# Patient Record
Sex: Female | Born: 1940 | Race: White | Marital: Married | Smoking: Never smoker
Health system: Southern US, Community
[De-identification: ages and names within clinical notes are randomized; demographics above are authoritative.]

## PROBLEM LIST (undated history)

## (undated) DIAGNOSIS — M329 Systemic lupus erythematosus, unspecified: Secondary | ICD-10-CM

## (undated) DIAGNOSIS — I10 Essential (primary) hypertension: Secondary | ICD-10-CM

## (undated) DIAGNOSIS — C801 Malignant (primary) neoplasm, unspecified: Secondary | ICD-10-CM

## (undated) DIAGNOSIS — S22080A Wedge compression fracture of T11-T12 vertebra, initial encounter for closed fracture: Secondary | ICD-10-CM

## (undated) DIAGNOSIS — M1990 Unspecified osteoarthritis, unspecified site: Secondary | ICD-10-CM

## (undated) DIAGNOSIS — F329 Major depressive disorder, single episode, unspecified: Secondary | ICD-10-CM

## (undated) DIAGNOSIS — F32A Depression, unspecified: Secondary | ICD-10-CM

## (undated) DIAGNOSIS — H409 Unspecified glaucoma: Secondary | ICD-10-CM

## (undated) DIAGNOSIS — E039 Hypothyroidism, unspecified: Secondary | ICD-10-CM

## (undated) DIAGNOSIS — J189 Pneumonia, unspecified organism: Secondary | ICD-10-CM

## (undated) DIAGNOSIS — F419 Anxiety disorder, unspecified: Secondary | ICD-10-CM

## (undated) DIAGNOSIS — M069 Rheumatoid arthritis, unspecified: Secondary | ICD-10-CM

## (undated) HISTORY — PX: CATARACT EXTRACTION, BILATERAL: SHX1313

## (undated) HISTORY — PX: DILATION AND CURETTAGE OF UTERUS: SHX78

## (undated) HISTORY — PX: BREAST SURGERY: SHX581

---

## 2015-12-18 ENCOUNTER — Other Ambulatory Visit: Payer: Self-pay | Admitting: Neurosurgery

## 2015-12-18 DIAGNOSIS — M542 Cervicalgia: Secondary | ICD-10-CM

## 2015-12-18 DIAGNOSIS — M48061 Spinal stenosis, lumbar region without neurogenic claudication: Secondary | ICD-10-CM

## 2015-12-25 ENCOUNTER — Ambulatory Visit
Admission: RE | Admit: 2015-12-25 | Discharge: 2015-12-25 | Disposition: A | Discharge status: Home / Self Care | Payer: Medicare Other | Source: Ambulatory Visit | Attending: Neurosurgery | Admitting: Neurosurgery

## 2015-12-25 DIAGNOSIS — M542 Cervicalgia: Secondary | ICD-10-CM

## 2015-12-25 DIAGNOSIS — M48061 Spinal stenosis, lumbar region without neurogenic claudication: Secondary | ICD-10-CM

## 2016-01-03 HISTORY — PX: BACK SURGERY: SHX140

## 2016-10-01 ENCOUNTER — Other Ambulatory Visit: Payer: Self-pay | Admitting: Neurosurgery

## 2016-10-02 ENCOUNTER — Encounter (HOSPITAL_COMMUNITY): Payer: Self-pay | Admitting: *Deleted

## 2016-10-03 NOTE — H&P (Signed)
Patient ID:   [id number] Patient: Danielle Castaneda  Date of Birth: Nov 03, 1941 Visit Type: Office Visit   Date: 09/30/2016 03:00 PM Provider: Marchia Meiers. Vertell Limber MD   This 75 year old female presents for back pain.  History of Present Illness: 1.  back pain    01/02/16 ACDF C7-T1  The patient comes in for evaluation of severe lumbar pain after she over-exerted herself while moving 4 weeks ago. She reports that her neck is doing well today, but her UE strength is not quite full. She states the pain is across her low back, but does not radiate into her legs. Today, she is struggling to walk due to the pain. Pain is 10/10 today.   Physical Exam: Deltoid strength is difficult to test due to low back pain, rest of UE strength is normal    X-rays reveal compression fracture of T12 with 50 % vertebral body height loss, levoconvex scoliosis        MEDICATIONS(added, continued or stopped this visit): Started Medication Directions Instruction Stopped   alendronate 35 mg tablet take 1 tablet by oral route  every week in the morning, at least 30 min before first food, beverage, or medication of day  09/30/2016   alprazolam 1 mg tablet take 1 tablet by oral route 2 times every day     Ambien 10 mg tablet take 1 tablet by oral route  every day at bedtime     baclofen 5mg  ORAL TABLET      ergocalciferol (vitamin D2) 50,000 unit capsule take 1 capsule by oral route  every week     gabapentin 800 mg tablet take 1 tablet by oral route 3 times every day     latanoprost 0.005 % eye drops instill 1 drop by ophthalmic route  every day into affected eye(s) in the evening     levothyroxine 175 mcg tablet take 1 tablet by oral route  every day     Lexapro 10 mg tablet take 1 tablet by oral route  every day    01/29/2016 Percocet 10 mg-325 mg tablet take 1/2 - 1 tab po q6hrs prn pain  09/30/2016  09/30/2016 Percocet 10 mg-325 mg tablet take 1/2 - 1 tab po q6hrs prn pain     Plaquenil 200 mg tablet take 1  tablet by oral route  every day     potassium take 1 tablet daily  09/30/2016   potassium chloride ER 20 mEq tablet,extended release take 1 tablet by oral route  every day     simvastatin take 1 tablet by oral route  every day in the evening  09/30/2016   sulfasalazine 500 mg tablet take 1 tablet by oral route 2 times every day after meals  09/30/2016  01/03/2016 tizanidine 2 mg tablet take 1-2 po up to TID prn spasms  09/30/2016   triamterene 37.5 mg-hydrochlorothiazide 25 mg tablet take 1 tablet by oral route  every day       ALLERGIES: Ingredient Reaction Medication Name Comment  LEVOFLOXACIN  Levaquin   TETRACYCLINE         Vitals Date Temp F BP Pulse Ht In Wt Lb BMI BSA Pain Score  09/30/2016  163/91 69 64 169 29.01  10/10      IMPRESSION The patient has been experiencing severe low back pain for the past 4 weeks. She is struggling to walk today due to the pain. On review of her X-rays done today, she has a compression fracture of T12. Since  she is in such severe pain, I recommend a T12 kyphoplasty for alleviation of her low back symptoms. I will refill her pain medication today for additional relief.  Completed Orders (this encounter) Order Details Reason Side Interpretation Result Initial Treatment Date Region  Thoraco-lumbar Spine- AP/Lat      09/30/2016    Assessment/Plan # Detail Type Description   1. 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         Refilled oxycodone. Schedule T12 kyphoplasty. Nurse education given.   Orders: Diagnostic Procedures: Assessment Procedure  M41.26 Thoraco-lumbar Spine- AP/Lat    MEDICATIONS PRESCRIBED TODAY    Rx Quantity Refills  PERCOCET 10 mg-325 mg  60 0            Provider:  Marchia Meiers. Vertell Limber MD  09/30/2016 03:48 PM Dictation edited by: Johnella Moloney    CC Providers: Erline Levine MD [address] [place], [place] [address]              Electronically signed by Marchia Meiers. Vertell Limber MD on 10/01/2016 05:07 PM

## 2016-10-04 ENCOUNTER — Ambulatory Visit (HOSPITAL_COMMUNITY): Payer: Medicare Other | Admitting: Certified Registered Nurse Anesthetist

## 2016-10-04 ENCOUNTER — Encounter (HOSPITAL_COMMUNITY): Payer: Self-pay | Admitting: Certified Registered Nurse Anesthetist

## 2016-10-04 ENCOUNTER — Encounter (HOSPITAL_COMMUNITY)
Admission: RE | Disposition: A | Discharge status: Home / Self Care | Payer: Self-pay | Source: Ambulatory Visit | Attending: Neurosurgery

## 2016-10-04 ENCOUNTER — Inpatient Hospital Stay (HOSPITAL_COMMUNITY): Payer: Medicare Other

## 2016-10-04 ENCOUNTER — Inpatient Hospital Stay (HOSPITAL_COMMUNITY)
Admission: RE | Admit: 2016-10-04 | Discharge: 2016-10-05 | DRG: 517 | Disposition: A | Discharge status: Home / Self Care | Payer: Medicare Other | Source: Ambulatory Visit | Attending: Neurosurgery | Admitting: Neurosurgery

## 2016-10-04 DIAGNOSIS — M4854XA Collapsed vertebra, not elsewhere classified, thoracic region, initial encounter for fracture: Principal | ICD-10-CM | POA: Diagnosis present

## 2016-10-04 DIAGNOSIS — E039 Hypothyroidism, unspecified: Secondary | ICD-10-CM | POA: Diagnosis present

## 2016-10-04 DIAGNOSIS — F329 Major depressive disorder, single episode, unspecified: Secondary | ICD-10-CM | POA: Diagnosis present

## 2016-10-04 DIAGNOSIS — M4120 Other idiopathic scoliosis, site unspecified: Secondary | ICD-10-CM | POA: Diagnosis present

## 2016-10-04 DIAGNOSIS — Z9889 Other specified postprocedural states: Secondary | ICD-10-CM

## 2016-10-04 DIAGNOSIS — F419 Anxiety disorder, unspecified: Secondary | ICD-10-CM | POA: Diagnosis present

## 2016-10-04 DIAGNOSIS — M549 Dorsalgia, unspecified: Secondary | ICD-10-CM | POA: Diagnosis present

## 2016-10-04 DIAGNOSIS — S22000A Wedge compression fracture of unspecified thoracic vertebra, initial encounter for closed fracture: Secondary | ICD-10-CM | POA: Diagnosis present

## 2016-10-04 HISTORY — DX: Unspecified osteoarthritis, unspecified site: M19.90

## 2016-10-04 HISTORY — DX: Malignant (primary) neoplasm, unspecified: C80.1

## 2016-10-04 HISTORY — DX: Systemic lupus erythematosus, unspecified: M32.9

## 2016-10-04 HISTORY — DX: Rheumatoid arthritis, unspecified: M06.9

## 2016-10-04 HISTORY — DX: Hypothyroidism, unspecified: E03.9

## 2016-10-04 HISTORY — DX: Major depressive disorder, single episode, unspecified: F32.9

## 2016-10-04 HISTORY — DX: Depression, unspecified: F32.A

## 2016-10-04 HISTORY — DX: Anxiety disorder, unspecified: F41.9

## 2016-10-04 HISTORY — PX: KYPHOPLASTY: SHX5884

## 2016-10-04 HISTORY — DX: Pneumonia, unspecified organism: J18.9

## 2016-10-04 LAB — BASIC METABOLIC PANEL
Anion gap: 10 (ref 5–15)
BUN: 10 mg/dL (ref 6–20)
CALCIUM: 9.2 mg/dL (ref 8.9–10.3)
CO2: 28 mmol/L (ref 22–32)
CREATININE: 0.7 mg/dL (ref 0.44–1.00)
Chloride: 94 mmol/L — ABNORMAL LOW (ref 101–111)
GFR calc non Af Amer: >60 mL/min (ref >60)
GLUCOSE: 91 mg/dL (ref 65–99)
Potassium: 3.3 mmol/L — ABNORMAL LOW (ref 3.5–5.1)
Sodium: 132 mmol/L — ABNORMAL LOW (ref 135–145)

## 2016-10-04 LAB — CBC
HCT: 40.6 % (ref 36.0–46.0)
Hemoglobin: 14.1 g/dL (ref 12.0–15.0)
MCH: 32.5 pg (ref 26.0–34.0)
MCHC: 34.7 g/dL (ref 30.0–36.0)
MCV: 93.5 fL (ref 78.0–100.0)
PLATELETS: 257 10*3/uL (ref 150–400)
RBC: 4.34 MIL/uL (ref 3.87–5.11)
RDW: 13.8 % (ref 11.5–15.5)
WBC: 8.1 10*3/uL (ref 4.0–10.5)

## 2016-10-04 LAB — SURGICAL PCR SCREEN
MRSA, PCR: POSITIVE — AB
Staphylococcus aureus: POSITIVE — AB

## 2016-10-04 SURGERY — KYPHOPLASTY
Anesthesia: General | Site: Back

## 2016-10-04 MED ORDER — IOPAMIDOL (ISOVUE-300) INJECTION 61%
INTRAVENOUS | Status: DC | PRN
  Administered 2016-10-04: 6 mL via INTRAVENOUS

## 2016-10-04 MED ORDER — ALPRAZOLAM 0.5 MG PO TABS
0.5000 mg | ORAL_TABLET | Freq: Every day | ORAL | Status: DC
  Administered 2016-10-04: 0.5 mg via ORAL
  Filled 2016-10-04: qty 1

## 2016-10-04 MED ORDER — METHOCARBAMOL 500 MG PO TABS
ORAL_TABLET | ORAL | Status: AC
  Filled 2016-10-04: qty 1

## 2016-10-04 MED ORDER — FENTANYL CITRATE (PF) 100 MCG/2ML IJ SOLN
INTRAMUSCULAR | Status: AC
  Filled 2016-10-04: qty 4

## 2016-10-04 MED ORDER — FENTANYL CITRATE (PF) 100 MCG/2ML IJ SOLN
25.0000 ug | INTRAMUSCULAR | Status: DC | PRN
  Administered 2016-10-04 (×2): 50 ug via INTRAVENOUS

## 2016-10-04 MED ORDER — OXYCODONE-ACETAMINOPHEN 10-325 MG PO TABS: 0.5000 | ORAL_TABLET | Freq: Four times a day (QID) | ORAL | Status: DC | PRN

## 2016-10-04 MED ORDER — KCL IN DEXTROSE-NACL 20-5-0.45 MEQ/L-%-% IV SOLN: INTRAVENOUS | Status: DC

## 2016-10-04 MED ORDER — ONDANSETRON HCL 4 MG/2ML IJ SOLN
INTRAMUSCULAR | Status: AC
  Filled 2016-10-04: qty 2

## 2016-10-04 MED ORDER — HYDROXYCHLOROQUINE SULFATE 200 MG PO TABS
200.0000 mg | ORAL_TABLET | Freq: Every day | ORAL | Status: DC
  Administered 2016-10-05: 200 mg via ORAL
  Filled 2016-10-04: qty 1

## 2016-10-04 MED ORDER — PROPOFOL 10 MG/ML IV BOLUS
INTRAVENOUS | Status: AC
  Filled 2016-10-04: qty 20

## 2016-10-04 MED ORDER — FENTANYL CITRATE (PF) 100 MCG/2ML IJ SOLN
INTRAMUSCULAR | Status: AC
  Filled 2016-10-04: qty 2

## 2016-10-04 MED ORDER — PROPOFOL 10 MG/ML IV BOLUS
INTRAVENOUS | Status: DC | PRN
Start: 2016-10-04 — End: 2016-10-04
  Administered 2016-10-04: 120 mg via INTRAVENOUS

## 2016-10-04 MED ORDER — 0.9 % SODIUM CHLORIDE (POUR BTL) OPTIME
TOPICAL | Status: DC | PRN
  Administered 2016-10-04 (×2): 1000 mL

## 2016-10-04 MED ORDER — DEXAMETHASONE SODIUM PHOSPHATE 10 MG/ML IJ SOLN
INTRAMUSCULAR | Status: DC | PRN
  Administered 2016-10-04: 10 mg via INTRAVENOUS

## 2016-10-04 MED ORDER — ROCURONIUM BROMIDE 10 MG/ML (PF) SYRINGE
PREFILLED_SYRINGE | INTRAVENOUS | Status: DC | PRN
  Administered 2016-10-04: 40 mg via INTRAVENOUS

## 2016-10-04 MED ORDER — PANTOPRAZOLE SODIUM 40 MG IV SOLR
40.0000 mg | Freq: Every day | INTRAVENOUS | Status: DC
  Administered 2016-10-04: 40 mg via INTRAVENOUS
  Filled 2016-10-04: qty 40

## 2016-10-04 MED ORDER — SODIUM CHLORIDE 0.9 % IV SOLN: 250.0000 mL | INTRAVENOUS | Status: DC

## 2016-10-04 MED ORDER — HYDROMORPHONE HCL 1 MG/ML IJ SOLN: 0.5000 mg | INTRAMUSCULAR | Status: DC | PRN

## 2016-10-04 MED ORDER — GABAPENTIN 800 MG PO TABS
800.0000 mg | ORAL_TABLET | Freq: Every day | ORAL | Status: DC
  Administered 2016-10-04: 800 mg via ORAL
  Filled 2016-10-04: qty 1

## 2016-10-04 MED ORDER — FLEET ENEMA 7-19 GM/118ML RE ENEM: 1.0000 | ENEMA | Freq: Once | RECTAL | Status: DC | PRN

## 2016-10-04 MED ORDER — LIDOCAINE 2% (20 MG/ML) 5 ML SYRINGE
INTRAMUSCULAR | Status: AC
  Filled 2016-10-04: qty 5

## 2016-10-04 MED ORDER — OXYCODONE-ACETAMINOPHEN 5-325 MG PO TABS
1.0000 | ORAL_TABLET | ORAL | Status: DC | PRN
  Administered 2016-10-04 – 2016-10-05 (×4): 2 via ORAL
  Filled 2016-10-04 (×3): qty 2

## 2016-10-04 MED ORDER — MENTHOL 3 MG MT LOZG: 1.0000 | LOZENGE | OROMUCOSAL | Status: DC | PRN

## 2016-10-04 MED ORDER — MIDAZOLAM HCL 2 MG/2ML IJ SOLN
INTRAMUSCULAR | Status: AC
  Filled 2016-10-04: qty 2

## 2016-10-04 MED ORDER — LIDOCAINE 2% (20 MG/ML) 5 ML SYRINGE
INTRAMUSCULAR | Status: DC | PRN
  Administered 2016-10-04: 60 mg via INTRAVENOUS

## 2016-10-04 MED ORDER — LATANOPROST 0.005 % OP SOLN
1.0000 [drp] | Freq: Every day | OPHTHALMIC | Status: DC
  Administered 2016-10-04: 1 [drp] via OPHTHALMIC
  Filled 2016-10-04: qty 2.5

## 2016-10-04 MED ORDER — TURMERIC CURCUMIN 500 MG PO CAPS: 1.0000 | ORAL_CAPSULE | Freq: Every day | ORAL | Status: DC

## 2016-10-04 MED ORDER — SODIUM CHLORIDE 0.9% FLUSH: 3.0000 mL | INTRAVENOUS | Status: DC | PRN

## 2016-10-04 MED ORDER — CEFAZOLIN SODIUM-DEXTROSE 2-4 GM/100ML-% IV SOLN
2.0000 g | INTRAVENOUS | Status: AC
  Administered 2016-10-04: 2 g via INTRAVENOUS
  Filled 2016-10-04: qty 100

## 2016-10-04 MED ORDER — CHLORHEXIDINE GLUCONATE CLOTH 2 % EX PADS: 6.0000 | MEDICATED_PAD | Freq: Once | CUTANEOUS | Status: DC

## 2016-10-04 MED ORDER — ZOLPIDEM TARTRATE 5 MG PO TABS: 5.0000 mg | ORAL_TABLET | Freq: Every evening | ORAL | Status: DC | PRN

## 2016-10-04 MED ORDER — TRIAMTERENE-HCTZ 37.5-25 MG PO CAPS
1.0000 | ORAL_CAPSULE | Freq: Every day | ORAL | Status: DC
  Filled 2016-10-04: qty 1

## 2016-10-04 MED ORDER — FENTANYL CITRATE (PF) 100 MCG/2ML IJ SOLN
INTRAMUSCULAR | Status: DC | PRN
  Administered 2016-10-04 (×4): 50 ug via INTRAVENOUS

## 2016-10-04 MED ORDER — LACTATED RINGERS IV SOLN
INTRAVENOUS | Status: DC
  Administered 2016-10-04 (×2): via INTRAVENOUS

## 2016-10-04 MED ORDER — HYDROCODONE-ACETAMINOPHEN 5-325 MG PO TABS
1.0000 | ORAL_TABLET | ORAL | Status: DC | PRN
  Administered 2016-10-04: 2 via ORAL
  Filled 2016-10-04: qty 2

## 2016-10-04 MED ORDER — EPHEDRINE SULFATE 50 MG/ML IJ SOLN
INTRAMUSCULAR | Status: DC | PRN
  Administered 2016-10-04 (×3): 5 mg via INTRAVENOUS

## 2016-10-04 MED ORDER — OXYCODONE-ACETAMINOPHEN 5-325 MG PO TABS
ORAL_TABLET | ORAL | Status: AC
  Filled 2016-10-04: qty 2

## 2016-10-04 MED ORDER — LEVOTHYROXINE SODIUM 175 MCG PO TABS
175.0000 ug | ORAL_TABLET | Freq: Every day | ORAL | Status: DC
  Administered 2016-10-05: 175 ug via ORAL
  Filled 2016-10-04: qty 1

## 2016-10-04 MED ORDER — COQ10 200 MG PO CAPS: 1.0000 | ORAL_CAPSULE | Freq: Every day | ORAL | Status: DC

## 2016-10-04 MED ORDER — ESCITALOPRAM OXALATE 10 MG PO TABS
10.0000 mg | ORAL_TABLET | Freq: Every day | ORAL | Status: DC
  Administered 2016-10-05: 10 mg via ORAL
  Filled 2016-10-04: qty 1

## 2016-10-04 MED ORDER — ARTIFICIAL TEARS OP OINT
TOPICAL_OINTMENT | OPHTHALMIC | Status: AC
  Filled 2016-10-04: qty 3.5

## 2016-10-04 MED ORDER — SODIUM CHLORIDE 0.9% FLUSH
3.0000 mL | Freq: Two times a day (BID) | INTRAVENOUS | Status: DC
  Administered 2016-10-04: 3 mL via INTRAVENOUS

## 2016-10-04 MED ORDER — ACETAMINOPHEN 325 MG PO TABS: 650.0000 mg | ORAL_TABLET | ORAL | Status: DC | PRN

## 2016-10-04 MED ORDER — PHENOL 1.4 % MT LIQD: 1.0000 | OROMUCOSAL | Status: DC | PRN

## 2016-10-04 MED ORDER — MUPIROCIN 2 % EX OINT
1.0000 "application " | TOPICAL_OINTMENT | Freq: Once | CUTANEOUS | Status: AC
  Administered 2016-10-04: 1 via TOPICAL
  Filled 2016-10-04: qty 22

## 2016-10-04 MED ORDER — DOCUSATE SODIUM 100 MG PO CAPS
100.0000 mg | ORAL_CAPSULE | Freq: Two times a day (BID) | ORAL | Status: DC
  Administered 2016-10-04 – 2016-10-05 (×2): 100 mg via ORAL
  Filled 2016-10-04 (×2): qty 1

## 2016-10-04 MED ORDER — SUGAMMADEX SODIUM 200 MG/2ML IV SOLN
INTRAVENOUS | Status: DC | PRN
  Administered 2016-10-04: 200 mg via INTRAVENOUS

## 2016-10-04 MED ORDER — SULFASALAZINE 500 MG PO TABS
500.0000 mg | ORAL_TABLET | Freq: Every day | ORAL | Status: DC
  Administered 2016-10-04 – 2016-10-05 (×2): 500 mg via ORAL
  Filled 2016-10-04 (×2): qty 1

## 2016-10-04 MED ORDER — ACETAMINOPHEN 650 MG RE SUPP: 650.0000 mg | RECTAL | Status: DC | PRN

## 2016-10-04 MED ORDER — ONDANSETRON HCL 4 MG/2ML IJ SOLN: 4.0000 mg | INTRAMUSCULAR | Status: DC | PRN

## 2016-10-04 MED ORDER — METHOCARBAMOL 500 MG PO TABS
500.0000 mg | ORAL_TABLET | Freq: Four times a day (QID) | ORAL | Status: DC | PRN
  Administered 2016-10-04 – 2016-10-05 (×2): 500 mg via ORAL
  Filled 2016-10-04: qty 1

## 2016-10-04 MED ORDER — ALUM & MAG HYDROXIDE-SIMETH 200-200-20 MG/5ML PO SUSP: 30.0000 mL | Freq: Four times a day (QID) | ORAL | Status: DC | PRN

## 2016-10-04 MED ORDER — ROCURONIUM BROMIDE 10 MG/ML (PF) SYRINGE
PREFILLED_SYRINGE | INTRAVENOUS | Status: AC
  Filled 2016-10-04: qty 10

## 2016-10-04 MED ORDER — BISACODYL 5 MG PO TBEC: 5.0000 mg | DELAYED_RELEASE_TABLET | Freq: Every day | ORAL | Status: DC | PRN

## 2016-10-04 MED ORDER — ZOLPIDEM TARTRATE 5 MG PO TABS
5.0000 mg | ORAL_TABLET | Freq: Every day | ORAL | Status: DC
  Administered 2016-10-04: 5 mg via ORAL
  Filled 2016-10-04: qty 1

## 2016-10-04 MED ORDER — BUPIVACAINE HCL (PF) 0.5 % IJ SOLN
INTRAMUSCULAR | Status: DC | PRN
  Administered 2016-10-04: 5 mL

## 2016-10-04 MED ORDER — SENNOSIDES-DOCUSATE SODIUM 8.6-50 MG PO TABS: 1.0000 | ORAL_TABLET | Freq: Every evening | ORAL | Status: DC | PRN

## 2016-10-04 MED ORDER — MAGNESIUM OXIDE 400 (241.3 MG) MG PO TABS
400.0000 mg | ORAL_TABLET | Freq: Every day | ORAL | Status: DC
  Filled 2016-10-04: qty 1

## 2016-10-04 MED ORDER — CEFAZOLIN IN D5W 1 GM/50ML IV SOLN
1.0000 g | Freq: Three times a day (TID) | INTRAVENOUS | Status: AC
  Administered 2016-10-04 – 2016-10-05 (×2): 1 g via INTRAVENOUS
  Filled 2016-10-04 (×2): qty 50

## 2016-10-04 MED ORDER — LIDOCAINE-EPINEPHRINE 1 %-1:100000 IJ SOLN
INTRAMUSCULAR | Status: DC | PRN
  Administered 2016-10-04: 5 mL

## 2016-10-04 MED ORDER — METHOCARBAMOL 1000 MG/10ML IJ SOLN
500.0000 mg | Freq: Four times a day (QID) | INTRAVENOUS | Status: DC | PRN
  Filled 2016-10-04: qty 5

## 2016-10-04 MED ORDER — VITAMIN D (ERGOCALCIFEROL) 1.25 MG (50000 UNIT) PO CAPS: 50000.0000 [IU] | ORAL_CAPSULE | ORAL | Status: DC

## 2016-10-04 MED ORDER — MIDAZOLAM HCL 5 MG/5ML IJ SOLN
INTRAMUSCULAR | Status: DC | PRN
  Administered 2016-10-04: 1 mg via INTRAVENOUS

## 2016-10-04 MED ORDER — ONDANSETRON HCL 4 MG/2ML IJ SOLN
INTRAMUSCULAR | Status: DC | PRN
Start: 2016-10-04 — End: 2016-10-04
  Administered 2016-10-04: 4 mg via INTRAVENOUS

## 2016-10-04 MED ORDER — TIZANIDINE HCL 4 MG PO TABS
4.0000 mg | ORAL_TABLET | Freq: Every day | ORAL | Status: DC
  Administered 2016-10-04: 4 mg via ORAL
  Filled 2016-10-04: qty 1

## 2016-10-04 SURGICAL SUPPLY — 43 items
BLADE CLIPPER SURG (BLADE) IMPLANT
BLADE SURG 15 STRL LF DISP TIS (BLADE) ×1 IMPLANT
BLADE SURG 15 STRL SS (BLADE) ×2
CARTRIDGE OIL MAESTRO DRILL (MISCELLANEOUS) ×1 IMPLANT
CEMENT KYPHON CX01A KIT/MIXER (Cement) ×3 IMPLANT
DECANTER SPIKE VIAL GLASS SM (MISCELLANEOUS) ×3 IMPLANT
DERMABOND ADVANCED (GAUZE/BANDAGES/DRESSINGS) ×2
DERMABOND ADVANCED .7 DNX12 (GAUZE/BANDAGES/DRESSINGS) ×1 IMPLANT
DIFFUSER DRILL AIR PNEUMATIC (MISCELLANEOUS) ×3 IMPLANT
DRAPE C-ARM 42X72 X-RAY (DRAPES) ×3 IMPLANT
DRAPE HALF SHEET 40X57 (DRAPES) ×3 IMPLANT
DRAPE LAPAROTOMY 100X72X124 (DRAPES) ×3 IMPLANT
DRAPE SURG 17X23 STRL (DRAPES) ×3 IMPLANT
DRAPE WARM FLUID 44X44 (DRAPE) ×3 IMPLANT
DURAPREP 26ML APPLICATOR (WOUND CARE) ×3 IMPLANT
GAUZE SPONGE 4X4 16PLY XRAY LF (GAUZE/BANDAGES/DRESSINGS) ×3 IMPLANT
GLOVE BIO SURGEON STRL SZ8 (GLOVE) ×3 IMPLANT
GLOVE BIOGEL M STRL SZ7.5 (GLOVE) ×3 IMPLANT
GLOVE BIOGEL PI IND STRL 8.5 (GLOVE) ×1 IMPLANT
GLOVE BIOGEL PI INDICATOR 8.5 (GLOVE) ×2
GLOVE EXAM NITRILE LRG STRL (GLOVE) IMPLANT
GLOVE EXAM NITRILE XL STR (GLOVE) IMPLANT
GLOVE EXAM NITRILE XS STR PU (GLOVE) IMPLANT
GOWN STRL REUS W/ TWL LRG LVL3 (GOWN DISPOSABLE) IMPLANT
GOWN STRL REUS W/ TWL XL LVL3 (GOWN DISPOSABLE) IMPLANT
GOWN STRL REUS W/TWL 2XL LVL3 (GOWN DISPOSABLE) IMPLANT
GOWN STRL REUS W/TWL LRG LVL3 (GOWN DISPOSABLE)
GOWN STRL REUS W/TWL XL LVL3 (GOWN DISPOSABLE)
KIT BASIN OR (CUSTOM PROCEDURE TRAY) ×3 IMPLANT
KIT ROOM TURNOVER OR (KITS) ×3 IMPLANT
NEEDLE HYPO 25X1 1.5 SAFETY (NEEDLE) ×3 IMPLANT
NS IRRIG 1000ML POUR BTL (IV SOLUTION) ×3 IMPLANT
OIL CARTRIDGE MAESTRO DRILL (MISCELLANEOUS) ×3
PACK SURGICAL SETUP 50X90 (CUSTOM PROCEDURE TRAY) ×3 IMPLANT
PAD ARMBOARD 7.5X6 YLW CONV (MISCELLANEOUS) ×9 IMPLANT
STAPLER SKIN PROX WIDE 3.9 (STAPLE) ×3 IMPLANT
SUT VIC AB 3-0 SH 8-18 (SUTURE) ×3 IMPLANT
SYR CONTROL 10ML LL (SYRINGE) ×6 IMPLANT
TOWEL OR 17X24 6PK STRL BLUE (TOWEL DISPOSABLE) ×3 IMPLANT
TOWEL OR 17X26 10 PK STRL BLUE (TOWEL DISPOSABLE) ×3 IMPLANT
TRAY KYPHOPAK 15/3 ONESTEP 1ST (MISCELLANEOUS) IMPLANT
TRAY KYPHOPAK 15/3 ONESTEP CDS ×3 IMPLANT
TRAY KYPHOPAK 20/3 ONESTEP 1ST (MISCELLANEOUS) IMPLANT

## 2016-10-04 NOTE — Op Note (Signed)
10/04/2016  3:31 PM  PATIENT:  Danielle Castaneda  75 y.o. female  PRE-OPERATIVE DIAGNOSIS:  COMPRESSION FRACTURE OF T12 VERTEBRA  POST-OPERATIVE DIAGNOSIS:  COMPRESSION FRACTURE OF T12 VERTEBRA  PROCEDURE:  Procedure(s) with comments: T12 KYPHOPLASTY (N/A) - T12 KYPHOPLASTY  SURGEON:  Surgeon(s) and Role:    * Erline Levine, MD - Primary  PHYSICIAN ASSISTANT:   ASSISTANTS: none   ANESTHESIA:   general  EBL:  Total I/O In: 1100 [I.V.:1100] Out: 10 [Blood:10]  BLOOD ADMINISTERED:none  DRAINS: none   LOCAL MEDICATIONS USED:  MARCAINE    and LIDOCAINE   SPECIMEN:  No Specimen  DISPOSITION OF SPECIMEN:  N/A  COUNTS:  YES  TOURNIQUET:  * No tourniquets in log *  DICTATION: DICTATION: Patient is 75 year old man with a painful T 12 compression fracture, which is proving debilitatingly painful to her.  It was elected to take her to surgery for kyphoplasty procedure.  PROCEDURE:  Following the smooth and uncomplicated induction of general endotracheal anesthesia, the patient was placed in a prone position on chest rolls.  C-arm fluoroscopy was positioned in both the AP and lateral planes, centered on the T 12 vertebra.  Her back was prepped and draped in the usual sterile fashion with Duraprep.  Using a bilateral extrapedicular approach, the T 12 vertebral body were entered with the trochar using standard landmarks.  The drill was used, followed by two15 cc Kyphon balloons, which were used to re-expand the broken vertebra.  Subsequently, 6 cc of bone cement was placed into the void created by the balloon and was seen to fill the fracture cleft and fill the vertebra in both the AP and lateral direction with good interdigitation and with a small amount of extravasation into the T 11/12 disc space, at which point the procedure was ended with good filling of the fractured vertebra. The bone void filler was then removed.  Final X-ray demonstrated good filling within the fractured vertebra.  The  incision was closed with two 3-0 vicryl stitches and dressed with Dermabond. The patient was returned to the [facility] and extubated in the OR and taken to Recovery in stable and satisfactory condition, having tolerated the procedure well.  Counts were correct at the end of the case.   PLAN OF CARE: Admit to inpatient   PATIENT DISPOSITION:  PACU - hemodynamically stable.   Delay start of Pharmacological VTE agent (>24hrs) due to surgical blood loss or risk of bleeding: yes

## 2016-10-04 NOTE — Progress Notes (Signed)
PHARMACIST - PHYSICIAN ORDER COMMUNICATION  CONCERNING: P&T Medication Policy on Herbal Medications  DESCRIPTION:  This patient's order for:  Co-Q 10 and Turmeric  has been noted.  This product(s) is classified as an "herbal" or natural product. Due to a lack of definitive safety studies or FDA approval, nonstandard manufacturing practices, plus the potential risk of unknown drug-drug interactions while on inpatient medications, the Pharmacy and Therapeutics Committee does not permit the use of "herbal" or natural products of this type within [facility].   ACTION TAKEN: The pharmacy department is unable to verify this order at this time and your patient has been informed of this safety policy. Please reevaluate patient's clinical condition at discharge and address if the herbal or natural product(s) should be resumed at that time.   Gracy Bruins, PharmD Clinical Pharmacist [facility]

## 2016-10-04 NOTE — Transfer of Care (Signed)
Immediate Anesthesia Transfer of Care Note  Patient: [name]  Procedure(s) Performed: Procedure(s) with comments: T12 KYPHOPLASTY (N/A) - T12 KYPHOPLASTY  Patient Location: PACU  Anesthesia Type:General  Level of Consciousness: awake, alert  and oriented  Airway & Oxygen Therapy: Patient Spontanous Breathing and Patient connected to nasal cannula oxygen  Post-op Assessment: Report given to RN and Post -op Vital signs reviewed and stable  Post vital signs: Reviewed and stable  Last Vitals:  Vitals:   10/04/16 1250 10/04/16 1522  BP: 131/73   Pulse: 65   Resp: 18   Temp:  (P) 36.3 C    Last Pain:  Vitals:   10/04/16 1308  TempSrc:   PainSc: 10-Worst pain ever      Patients Stated Pain Goal: 4 ([id number])  Complications: No apparent anesthesia complications

## 2016-10-04 NOTE — Progress Notes (Signed)
Patient is awake and conversant.  Back pain is improved.  Patient is doing well.

## 2016-10-04 NOTE — Interval H&P Note (Signed)
History and Physical Interval Note:  10/04/2016 1:22 PM  Danielle Castaneda  has presented today for surgery, with the diagnosis of COMPRESSION FRACTURE OF T12 VERTEBRA  The various methods of treatment have been discussed with the patient and family. After consideration of risks, benefits and other options for treatment, the patient has consented to  Procedure(s) with comments: T12 KYPHOPLASTY (N/A) - T12 KYPHOPLASTY as a surgical intervention .  The patient's history has been reviewed, patient examined, no change in status, stable for surgery.  I have reviewed the patient's chart and labs.  Questions were answered to the patient's satisfaction.     Rainier Feuerborn D

## 2016-10-04 NOTE — Anesthesia Procedure Notes (Addendum)
Procedure Name: Intubation Date/Time: 10/04/2016 2:13 PM Performed by: Candis Shine Pre-anesthesia Checklist: Patient identified, Emergency Drugs available, Suction available and Patient being monitored Patient Re-evaluated:Patient Re-evaluated prior to inductionOxygen Delivery Method: Circle System Utilized Preoxygenation: Pre-oxygenation with 100% oxygen Intubation Type: IV induction Ventilation: Mask ventilation without difficulty Laryngoscope Size: Mac and 3 Grade View: Grade I Tube type: Oral Tube size: 7.0 mm Number of attempts: 1 Airway Equipment and Method: Stylet Placement Confirmation: ETT inserted through vocal cords under direct vision,  positive ETCO2 and breath sounds checked- equal and bilateral Secured at: 22 cm Tube secured with: Tape Dental Injury: Teeth and Oropharynx as per pre-operative assessment

## 2016-10-04 NOTE — Brief Op Note (Signed)
10/04/2016  3:31 PM  PATIENT:  Danielle Castaneda  75 y.o. female  PRE-OPERATIVE DIAGNOSIS:  COMPRESSION FRACTURE OF T12 VERTEBRA  POST-OPERATIVE DIAGNOSIS:  COMPRESSION FRACTURE OF T12 VERTEBRA  PROCEDURE:  Procedure(s) with comments: T12 KYPHOPLASTY (N/A) - T12 KYPHOPLASTY  SURGEON:  Surgeon(s) and Role:    * Erline Levine, MD - Primary  PHYSICIAN ASSISTANT:   ASSISTANTS: none   ANESTHESIA:   general  EBL:  Total I/O In: 1100 [I.V.:1100] Out: 10 [Blood:10]  BLOOD ADMINISTERED:none  DRAINS: none   LOCAL MEDICATIONS USED:  MARCAINE    and LIDOCAINE   SPECIMEN:  No Specimen  DISPOSITION OF SPECIMEN:  N/A  COUNTS:  YES  TOURNIQUET:  * No tourniquets in log *  DICTATION: DICTATION: Patient is 75 year old man with a painful T 12 compression fracture, which is proving debilitatingly painful to her.  It was elected to take her to surgery for kyphoplasty procedure.  PROCEDURE:  Following the smooth and uncomplicated induction of general endotracheal anesthesia, the patient was placed in a prone position on chest rolls.  C-arm fluoroscopy was positioned in both the AP and lateral planes, centered on the T 12 vertebra.  Her back was prepped and draped in the usual sterile fashion with Duraprep.  Using a bilateral extrapedicular approach, the T 12 vertebral body were entered with the trochar using standard landmarks.  The drill was used, followed by two15 cc Kyphon balloons, which were used to re-expand the broken vertebra.  Subsequently, 6 cc of bone cement was placed into the void created by the balloon and was seen to fill the fracture cleft and fill the vertebra in both the AP and lateral direction with good interdigitation and with a small amount of extravasation into the T 11/12 disc space, at which point the procedure was ended with good filling of the fractured vertebra. The bone void filler was then removed.  Final X-ray demonstrated good filling within the fractured vertebra.  The  incision was closed with two 3-0 vicryl stitches and dressed with Dermabond. The patient was returned to the [facility] and extubated in the OR and taken to Recovery in stable and satisfactory condition, having tolerated the procedure well.  Counts were correct at the end of the case.   PLAN OF CARE: Admit to inpatient   PATIENT DISPOSITION:  PACU - hemodynamically stable.   Delay start of Pharmacological VTE agent (>24hrs) due to surgical blood loss or risk of bleeding: yes

## 2016-10-04 NOTE — Anesthesia Preprocedure Evaluation (Addendum)
Anesthesia Evaluation  Patient identified by MRN, date of birth, ID band Patient awake    Reviewed: Allergy & Precautions, H&P , NPO status , Patient's Chart, lab work & pertinent test results  Airway Mallampati: I  TM Distance: >3 FB Neck ROM: Full    Dental no notable dental hx. (+) Dental Advisory Given, Teeth Intact   Pulmonary neg pulmonary ROS,    Pulmonary exam normal breath sounds clear to auscultation       Cardiovascular negative cardio ROS   Rhythm:Regular Rate:Normal     Neuro/Psych PSYCHIATRIC DISORDERS Anxiety Depression negative neurological ROS     GI/Hepatic negative GI ROS, Neg liver ROS,   Endo/Other  Hypothyroidism   Renal/GU negative Renal ROS  negative genitourinary   Musculoskeletal  (+) Arthritis , Rheumatoid disorders,    Abdominal   Peds  Hematology negative hematology ROS (+)   Anesthesia Other Findings   Reproductive/Obstetrics negative OB ROS                            Anesthesia Physical Anesthesia Plan  ASA: II  Anesthesia Plan: General   Post-op Pain Management:    Induction: Intravenous  Airway Management Planned: Oral ETT  Additional Equipment:   Intra-op Plan:   Post-operative Plan: Extubation in OR  Informed Consent: I have reviewed the patients History and Physical, chart, labs and discussed the procedure including the risks, benefits and alternatives for the proposed anesthesia with the patient or authorized representative who has indicated his/her understanding and acceptance.   Dental advisory given  Plan Discussed with: CRNA, Anesthesiologist and Surgeon  Anesthesia Plan Comments:         Anesthesia Quick Evaluation

## 2016-10-05 MED ORDER — OXYCODONE-ACETAMINOPHEN 10-325 MG PO TABS: 0.5000 | ORAL_TABLET | Freq: Four times a day (QID) | ORAL | 0 refills | Status: DC | PRN

## 2016-10-05 MED ORDER — METHOCARBAMOL 750 MG PO TABS: 750.0000 mg | ORAL_TABLET | Freq: Four times a day (QID) | ORAL | 2 refills | Status: DC | PRN

## 2016-10-05 NOTE — Discharge Instructions (Signed)
Wound Care Leave incision open to air. You may shower. Do not scrub directly on incision.  Do not put any creams, lotions, or ointments on incision. Activity Walk each and every day, increasing distance each day. No lifting greater than 5 lbs.  Avoid bending, arching, and twisting. No driving for 2 weeks; may ride as a passenger locally.  Diet Resume your normal diet.  Return to Work Will be discussed at you follow up appointment. Call Your Doctor If Any of These Occur Redness, drainage, or swelling at the wound.  Temperature greater than 101 degrees. Severe pain not relieved by pain medication. Incision starts to come apart. Follow Up Appt Call today for appointment in 3-4 weeks ([phone]) or for problems.  If you have any hardware placed in your spine, you will need an x-ray before your appointment.  

## 2016-10-05 NOTE — Evaluation (Signed)
Physical Therapy Evaluation Patient Details Name: Danielle Castaneda MRN: [id number] DOB: [birth year] Today's Date: 10/05/2016   History of Present Illness  Pt admitted 10/05/16 for T12 KYPHOPLASTY.  PMH significant for anxiety, RA, breast cancer, and cervical fusion.   Clinical Impression  Patient is s/p above surgery.  Pt with decreased knowledge of how to move safely after back surgery.  Educated pt and husband. I have encouraged the patient to gradually increase activity daily to tolerance.  I have answered all patient's question regarding PT and mobility. Pt and husband feel ready for DC home today. No further PT needs.      Follow Up Recommendations No PT follow up;Supervision for mobility/OOB    Equipment Recommendations  None recommended by PT    Recommendations for Other Services OT consult     Precautions / Restrictions Precautions Precautions: Back Precaution Booklet Issued: Yes (comment) Precaution Comments: issued handout      Mobility  Bed Mobility Overal bed mobility: Needs Assistance Bed Mobility: Rolling;Sidelying to Sit Rolling: Supervision Sidelying to sit: Supervision       General bed mobility comments: Instructional cues on log rolling   Transfers Overall transfer level: Needs assistance Equipment used: None Transfers: Sit to/from Stand Sit to Stand: Supervision         General transfer comment: cues for safest technique  Ambulation/Gait Ambulation/Gait assistance: Supervision Ambulation Distance (Feet): 250 Feet Assistive device: None Gait Pattern/deviations: Decreased stride length     General Gait Details: no loss of balance. Took 1 standing rest break  Financial trader Rankin (Stroke Patients Only)       Balance Overall balance assessment: No apparent balance deficits (not formally assessed)                                           Pertinent Vitals/Pain Pain Assessment:  0-10 Pain Score: 7  Pain Location: low back and around wasitline Pain Intervention(s): Limited activity within patient's tolerance;Premedicated before session    [place] expects to be discharged to:: Private residence Living Arrangements: Spouse/significant other Available Help at Discharge: Family;Available 24 hours/day Type of Home: Other(Comment) (1 level townhouse) Home Access: Level entry     Home Layout: One level Home Equipment: Shower seat;Other (comment) (stool to get into bed)      Prior Function Level of Independence: Independent               Hand Dominance        Extremity/Trunk Assessment   Upper Extremity Assessment: Defer to OT evaluation           Lower Extremity Assessment: Overall WFL for tasks assessed      Cervical / Trunk Assessment: Normal  Communication   Communication: No difficulties  Cognition Arousal/Alertness: Awake/alert Behavior During Therapy: WFL for tasks assessed/performed Overall Cognitive Status: Within Functional Limits for tasks assessed                      General Comments General comments (skin integrity, edema, etc.): husband present for evaluation.     Exercises     Assessment/Plan    PT Assessment Patent does not need any further PT services  PT Problem List            PT Treatment Interventions  PT Goals (Current goals can be found in the Care Plan section)  Acute Rehab PT Goals Patient Stated Goal: to dance at her grandson's wedding this coming weekend    Frequency     Barriers to discharge        Co-evaluation               End of Session Equipment Utilized During Treatment: Gait belt Activity Tolerance: Patient tolerated treatment well Patient left: in bed;with family/visitor present;with call bell/phone within reach Nurse Communication: Mobility status    Functional Assessment Tool Used: clinical judgement Functional Limitation: Mobility:  Walking and moving around Mobility: Walking and Moving Around Current Status [id number]): At least 1 percent but less than 20 percent impaired, limited or restricted Mobility: Walking and Moving Around Goal Status [phone]): At least 1 percent but less than 20 percent impaired, limited or restricted Mobility: Walking and Moving Around Discharge Status [phone]): At least 1 percent but less than 20 percent impaired, limited or restricted    Time: 0801-0827 PT Time Calculation (min) (ACUTE ONLY): 26 min   Charges:   PT Evaluation $PT Eval Low Complexity: 1 Procedure PT Treatments $Gait Training: 8-22 mins   PT G Codes:   PT G-Codes **NOT FOR INPATIENT CLASS** Functional Assessment Tool Used: clinical judgement Functional Limitation: Mobility: Walking and moving around Mobility: Walking and Moving Around Current Status [id number]): At least 1 percent but less than 20 percent impaired, limited or restricted Mobility: Walking and Moving Around Goal Status [phone]): At least 1 percent but less than 20 percent impaired, limited or restricted Mobility: Walking and Moving Around Discharge Status [phone]): At least 1 percent but less than 20 percent impaired, limited or restricted    Melvern Banker 10/05/2016, 8:42 AM  Lavonia Dana, PT  [phone] 10/05/2016

## 2016-10-05 NOTE — Discharge Summary (Signed)
Date of Admission: 10/04/2016  Date of Discharge: 10/05/16  PRE-OPERATIVE DIAGNOSIS:  COMPRESSION FRACTURE OF T12 VERTEBRA  POST-OPERATIVE DIAGNOSIS:  COMPRESSION FRACTURE OF T12 VERTEBRA  PROCEDURE:  Procedure(s) with comments: T12 KYPHOPLASTY (N/A) - T12 KYPHOPLASTY  Attending: Erline Levine, MD  Hospital Course:  The patient was admitted for the above listed operation and had an uncomplicated post-operative course.  They were discharged in stable condition.  Follow up: 3 weeks    Medication List    TAKE these medications   ALPRAZolam 1 MG tablet Commonly known as:  XANAX Take 0.5 mg by mouth at bedtime.   CoQ10 200 MG Caps Take 1 capsule by mouth daily.   escitalopram 20 MG tablet Commonly known as:  LEXAPRO Take 10 mg by mouth daily.   gabapentin 800 MG tablet Commonly known as:  NEURONTIN Take 800 mg by mouth at bedtime.   hydroxychloroquine 200 MG tablet Commonly known as:  PLAQUENIL Take 200 mg by mouth daily.   latanoprost 0.005 % ophthalmic solution Commonly known as:  XALATAN Place 1 drop into both eyes at bedtime.   levothyroxine 175 MCG tablet Commonly known as:  SYNTHROID, LEVOTHROID Take 175 mcg by mouth daily before breakfast.   Magnesium 400 MG Tabs Take 1 tablet by mouth daily.   methocarbamol 750 MG tablet Commonly known as:  ROBAXIN Take 1 tablet (750 mg total) by mouth every 6 (six) hours as needed for muscle spasms.   Omega-3 Krill Oil 500 MG Caps Take by mouth.   oxyCODONE-acetaminophen 10-325 MG tablet Commonly known as:  PERCOCET Take 0.5-1 tablets by mouth every 6 (six) hours as needed for pain.   sulfaSALAzine 500 MG tablet Commonly known as:  AZULFIDINE Take 500 mg by mouth daily.   tiZANidine 4 MG tablet Commonly known as:  ZANAFLEX Take 4 mg by mouth at bedtime.   triamterene-hydrochlorothiazide 37.5-25 MG capsule Commonly known as:  DYAZIDE Take 1 capsule by mouth daily.   Turmeric Curcumin 500 MG Caps Take 1  capsule by mouth daily. With 50 mg of Ginger   Vitamin D (Ergocalciferol) 50000 units Caps capsule Commonly known as:  DRISDOL Take 50,000 Units by mouth every 7 (seven) days. Saturdays   zolpidem 10 MG tablet Commonly known as:  AMBIEN Take 5 mg by mouth at bedtime. And if pt is unable to fall asleep will take the other half of the tablet

## 2016-10-05 NOTE — Evaluation (Signed)
Occupational Therapy Evaluation and Discharge Patient Details Name: Danielle Castaneda MRN: [id number] DOB: May 10, 1941 Today's Date: 10/05/2016    History of Present Illness Pt admitted 10/05/16 for T12 KYPHOPLASTY.  PMH significant for anxiety, RA, breast cancer, and cervical fusion.    Clinical Impression   Pt reports she was independent with ADL PTA. Currently pt overall supervision for ADL and functional mobility; required min assist for LB dressing today but pt reports husband can assist as needed. All back, safety, and ADL education completed with pt; she currently has no further questions or concerns for OT at this time. Pt planning to d/c home with 24/7 supervision from her husband. No further acute OT needs identified; signing off at this time. Please re-consult if needs change. Thank you for this referral.    Follow Up Recommendations  No OT follow up;Supervision/Assistance - 24 hour (initially)    Equipment Recommendations  None recommended by OT    Recommendations for Other Services       Precautions / Restrictions Precautions Precautions: Back Precaution Booklet Issued: No (PT already provided handout) Precaution Comments: Pt able to recall 1/3 back precautions. Reviewed all back precautions with pt. Restrictions Weight Bearing Restrictions: No      Mobility Bed Mobility Overal bed mobility: Needs Assistance Bed Mobility: Rolling;Sidelying to Sit Rolling: Supervision Sidelying to sit: Supervision       General bed mobility comments: Cues for technique. HOB flat with use of bed rail.  Transfers Overall transfer level: Needs assistance Equipment used: None Transfers: Sit to/from Stand Sit to Stand: Supervision         General transfer comment: Cues for hand placement, pt with good technique.    Balance Overall balance assessment: No apparent balance deficits (not formally assessed)                                          ADL Overall  ADL's : Needs assistance/impaired Eating/Feeding: Set up;Sitting   Grooming: Supervision/safety;Standing;Wash/dry hands Grooming Details (indicate cue type and reason): Educated on use of 2 cups for oral care Upper Body Bathing: Set up;Supervision/ safety;Sitting   Lower Body Bathing: Supervison/ safety;Sit to/from stand;Set up   Upper Body Dressing : Set up;Supervision/safety;Sitting   Lower Body Dressing: Minimal assistance;Sit to/from stand Lower Body Dressing Details (indicate cue type and reason): Min assist to don house shoes. Pt able to cross foot over opposite knee. Educated on compensatory strategies for LB ADL. Pt reports husband can assist with ADL as needed. Toilet Transfer: Supervision/safety;Ambulation;BSC   Toileting- Water quality scientist and Hygiene: Supervision/safety;Sit to/from stand Toileting - Clothing Manipulation Details (indicate cue type and reason): Educated on what she can use as a toilet aide if needed, proper technique for peri care without twisting, use of wet wipes. Tub/ Shower Transfer: Supervision/safety;Tub transfer;Ambulation;Shower Scientist, research (medical) Details (indicate cue type and reason): Simulated tub transfer. Educated on supervision for safety with tub transfers upon return home. Functional mobility during ADLs: Supervision/safety General ADL Comments: Educated pt on maintaining back precautions during functional activities, log roll technique for bed mobility, keeping frequently used items at counter top height.     Vision Vision Assessment?: No apparent visual deficits   Perception     Praxis      Pertinent Vitals/Pain Pain Assessment: 0-10 Pain Score: 7  Pain Location: low back Pain Descriptors / Indicators: Aching;Sore Pain Intervention(s): Monitored during session;Repositioned  Hand Dominance     Extremity/Trunk Assessment Upper Extremity Assessment Upper Extremity Assessment: Overall WFL for tasks assessed   Lower  Extremity Assessment Lower Extremity Assessment: Defer to PT evaluation   Cervical / Trunk Assessment Cervical / Trunk Assessment: Normal;Other exceptions (s/p spinal sx)   Communication Communication Communication: No difficulties   Cognition Arousal/Alertness: Awake/alert Behavior During Therapy: WFL for tasks assessed/performed Overall Cognitive Status: Within Functional Limits for tasks assessed       Memory: Decreased recall of precautions             General Comments       Exercises       Shoulder Instructions      Home Living Family/patient expects to be discharged to:: Private residence Living Arrangements: Spouse/significant other Available Help at Discharge: Family;Available 24 hours/day Type of Home: Other(Comment) (townhome) Home Access: Level entry     Home Layout: One level     Bathroom Shower/Tub: Tub/shower unit Shower/tub characteristics: Architectural technologist: Standard     Home Equipment: Shower seat;Hand held shower head          Prior Functioning/Environment Level of Independence: Independent                 OT Problem List:     OT Treatment/Interventions:      OT Goals(Current goals can be found in the care plan section) Acute Rehab OT Goals Patient Stated Goal: to dance at her grandson's wedding this coming weekend OT Goal Formulation: All assessment and education complete, DC therapy  OT Frequency:     Barriers to D/C:            Co-evaluation              End of Session Nurse Communication: Mobility status  Activity Tolerance: Patient tolerated treatment well Patient left: in chair;with call bell/phone within reach   Time: 0843-0901 OT Time Calculation (min): 18 min Charges:  OT General Charges $OT Visit: 1 Procedure OT Evaluation $OT Eval Moderate Complexity: 1 Procedure G-Codes:     Binnie Kand M.S., OTR/L Pager: [phone]  10/05/2016, 9:11 AM

## 2016-10-05 NOTE — Progress Notes (Signed)
Patient alert and oriented, mae's well, voiding adequate amount of urine, swallowing without difficulty, no c/o pain. Patient discharged home with family. Script and discharged instructions given to patient. Patient and family stated understanding of d/c instructions given and has an appointment with MD. 

## 2016-10-07 ENCOUNTER — Encounter (HOSPITAL_COMMUNITY): Payer: Self-pay | Admitting: Neurosurgery

## 2016-10-07 NOTE — Anesthesia Postprocedure Evaluation (Signed)
Anesthesia Post Note  Patient: Danielle Castaneda  Procedure(s) Performed: Procedure(s) (LRB): T12 KYPHOPLASTY (N/A)  Patient location during evaluation: PACU Anesthesia Type: General Level of consciousness: awake and alert Pain management: pain level controlled Vital Signs Assessment: post-procedure vital signs reviewed and stable Respiratory status: spontaneous breathing, nonlabored ventilation, respiratory function stable and patient connected to nasal cannula oxygen Cardiovascular status: blood pressure returned to baseline and stable Postop Assessment: no signs of nausea or vomiting Anesthetic complications: no    Last Vitals:  Vitals:   10/05/16 0430 10/05/16 0803  BP: 105/63 (!) 111/53  Pulse: (!) 58 65  Resp: 18 18  Temp: 36.7 C 36.9 C    Last Pain:  Vitals:   10/05/16 0803  TempSrc: Oral  PainSc:                  Kymberlyn Eckford,W. EDMOND

## 2017-01-17 ENCOUNTER — Encounter (HOSPITAL_COMMUNITY): Payer: Self-pay | Admitting: Neurosurgery

## 2017-01-20 ENCOUNTER — Encounter (HOSPITAL_COMMUNITY): Payer: Self-pay | Admitting: Neurosurgery

## 2017-03-11 ENCOUNTER — Other Ambulatory Visit: Payer: Self-pay | Admitting: Neurosurgery

## 2017-03-11 DIAGNOSIS — M545 Low back pain: Secondary | ICD-10-CM

## 2017-03-13 ENCOUNTER — Ambulatory Visit
Admission: RE | Admit: 2017-03-13 | Discharge: 2017-03-13 | Disposition: A | Discharge status: Home / Self Care | Payer: Medicare Other | Source: Ambulatory Visit | Attending: Neurosurgery | Admitting: Neurosurgery

## 2017-03-13 DIAGNOSIS — M545 Low back pain: Secondary | ICD-10-CM

## 2017-04-15 ENCOUNTER — Other Ambulatory Visit: Payer: Self-pay | Admitting: Neurosurgery

## 2017-04-21 ENCOUNTER — Encounter (HOSPITAL_COMMUNITY): Payer: Self-pay | Admitting: *Deleted

## 2017-04-21 MED ORDER — CEFAZOLIN SODIUM-DEXTROSE 2-4 GM/100ML-% IV SOLN
2.0000 g | INTRAVENOUS | Status: AC
  Administered 2017-04-22: 2 g via INTRAVENOUS
  Filled 2017-04-21: qty 100

## 2017-04-21 NOTE — Progress Notes (Signed)
Pt denies SOB, chest pain, and being under the care of a cardiologist. Pt stated that an echo was performed 20 years ago but denies having a stress test and cardiac cath. Pt denies having a chest x ray and EKG within the last year. Pt denies having recent labs. Pt made aware to stop taking Aspirin, vitamins, fish oil, krill oil, Turmeric, Co Q 10 and herbal medications. Do not take any NSAIDs ie: Ibuprofen, Advil, Naproxen, BC and Goody Powder or any medication containing Aspirin. Pt verbalized understanding of all pre-op instructions.

## 2017-04-22 ENCOUNTER — Observation Stay (HOSPITAL_COMMUNITY)
Admission: RE | Admit: 2017-04-22 | Discharge: 2017-04-23 | Disposition: A | Discharge status: Home / Self Care | Payer: Medicare Other | Source: Ambulatory Visit | Attending: Neurosurgery | Admitting: Neurosurgery

## 2017-04-22 ENCOUNTER — Encounter (HOSPITAL_COMMUNITY)
Admission: RE | Disposition: A | Discharge status: Home / Self Care | Payer: Self-pay | Source: Ambulatory Visit | Attending: Neurosurgery

## 2017-04-22 ENCOUNTER — Ambulatory Visit (HOSPITAL_COMMUNITY): Payer: Medicare Other | Admitting: Anesthesiology

## 2017-04-22 ENCOUNTER — Encounter (HOSPITAL_COMMUNITY): Payer: Self-pay | Admitting: *Deleted

## 2017-04-22 ENCOUNTER — Ambulatory Visit (HOSPITAL_COMMUNITY): Payer: Medicare Other

## 2017-04-22 DIAGNOSIS — M329 Systemic lupus erythematosus, unspecified: Secondary | ICD-10-CM | POA: Diagnosis not present

## 2017-04-22 DIAGNOSIS — M4854XA Collapsed vertebra, not elsewhere classified, thoracic region, initial encounter for fracture: Secondary | ICD-10-CM | POA: Diagnosis present

## 2017-04-22 DIAGNOSIS — S22080A Wedge compression fracture of T11-T12 vertebra, initial encounter for closed fracture: Secondary | ICD-10-CM | POA: Diagnosis present

## 2017-04-22 DIAGNOSIS — E039 Hypothyroidism, unspecified: Secondary | ICD-10-CM | POA: Insufficient documentation

## 2017-04-22 DIAGNOSIS — R262 Difficulty in walking, not elsewhere classified: Secondary | ICD-10-CM | POA: Insufficient documentation

## 2017-04-22 DIAGNOSIS — Z79899 Other long term (current) drug therapy: Secondary | ICD-10-CM | POA: Diagnosis not present

## 2017-04-22 DIAGNOSIS — Z853 Personal history of malignant neoplasm of breast: Secondary | ICD-10-CM | POA: Diagnosis not present

## 2017-04-22 DIAGNOSIS — Z419 Encounter for procedure for purposes other than remedying health state, unspecified: Secondary | ICD-10-CM

## 2017-04-22 DIAGNOSIS — I10 Essential (primary) hypertension: Secondary | ICD-10-CM | POA: Diagnosis not present

## 2017-04-22 DIAGNOSIS — R269 Unspecified abnormalities of gait and mobility: Secondary | ICD-10-CM | POA: Diagnosis not present

## 2017-04-22 DIAGNOSIS — K219 Gastro-esophageal reflux disease without esophagitis: Secondary | ICD-10-CM | POA: Insufficient documentation

## 2017-04-22 HISTORY — PX: KYPHOPLASTY: SHX5884

## 2017-04-22 HISTORY — DX: Unspecified glaucoma: H40.9

## 2017-04-22 HISTORY — DX: Wedge compression fracture of T11-T12 vertebra, initial encounter for closed fracture: S22.080A

## 2017-04-22 HISTORY — DX: Essential (primary) hypertension: I10

## 2017-04-22 LAB — CBC
HEMATOCRIT: 43.7 % (ref 36.0–46.0)
Hemoglobin: 15.5 g/dL — ABNORMAL HIGH (ref 12.0–15.0)
MCH: 32.6 pg (ref 26.0–34.0)
MCHC: 35.5 g/dL (ref 30.0–36.0)
MCV: 92 fL (ref 78.0–100.0)
PLATELETS: 286 10*3/uL (ref 150–400)
RBC: 4.75 MIL/uL (ref 3.87–5.11)
RDW: 13.3 % (ref 11.5–15.5)
WBC: 11 10*3/uL — AB (ref 4.0–10.5)

## 2017-04-22 LAB — BASIC METABOLIC PANEL
Anion gap: 12 (ref 5–15)
BUN: 20 mg/dL (ref 6–20)
CO2: 28 mmol/L (ref 22–32)
CREATININE: 1.1 mg/dL — AB (ref 0.44–1.00)
Calcium: 10.1 mg/dL (ref 8.9–10.3)
Chloride: 98 mmol/L — ABNORMAL LOW (ref 101–111)
GFR calc Af Amer: 55 mL/min — ABNORMAL LOW (ref >60)
GFR, EST NON AFRICAN AMERICAN: 48 mL/min — AB (ref >60)
Glucose, Bld: 112 mg/dL — ABNORMAL HIGH (ref 65–99)
POTASSIUM: 3 mmol/L — AB (ref 3.5–5.1)
SODIUM: 138 mmol/L (ref 135–145)

## 2017-04-22 LAB — SURGICAL PCR SCREEN
MRSA, PCR: NEGATIVE
STAPHYLOCOCCUS AUREUS: NEGATIVE

## 2017-04-22 SURGERY — KYPHOPLASTY
Anesthesia: General | Site: Spine Thoracic

## 2017-04-22 MED ORDER — SUGAMMADEX SODIUM 200 MG/2ML IV SOLN
INTRAVENOUS | Status: AC
  Filled 2017-04-22: qty 2

## 2017-04-22 MED ORDER — LORATADINE 10 MG PO TABS
10.0000 mg | ORAL_TABLET | Freq: Every day | ORAL | Status: DC
  Administered 2017-04-23: 10 mg via ORAL
  Filled 2017-04-22: qty 1

## 2017-04-22 MED ORDER — ALPRAZOLAM 0.5 MG PO TABS
0.5000 mg | ORAL_TABLET | Freq: Every day | ORAL | Status: DC
  Administered 2017-04-22: 0.5 mg via ORAL
  Filled 2017-04-22: qty 1

## 2017-04-22 MED ORDER — CEFAZOLIN SODIUM-DEXTROSE 2-4 GM/100ML-% IV SOLN
2.0000 g | Freq: Three times a day (TID) | INTRAVENOUS | Status: AC
  Administered 2017-04-22 – 2017-04-23 (×2): 2 g via INTRAVENOUS
  Filled 2017-04-22 (×2): qty 100

## 2017-04-22 MED ORDER — SODIUM CHLORIDE 0.9% FLUSH: 3.0000 mL | INTRAVENOUS | Status: DC | PRN

## 2017-04-22 MED ORDER — BISACODYL 10 MG RE SUPP: 10.0000 mg | Freq: Every day | RECTAL | Status: DC | PRN

## 2017-04-22 MED ORDER — TRIAMCINOLONE ACETONIDE 0.1 % EX CREA
1.0000 "application " | TOPICAL_CREAM | Freq: Two times a day (BID) | CUTANEOUS | Status: DC | PRN
  Filled 2017-04-22: qty 15

## 2017-04-22 MED ORDER — METHOCARBAMOL 750 MG PO TABS
750.0000 mg | ORAL_TABLET | Freq: Four times a day (QID) | ORAL | Status: DC | PRN
  Administered 2017-04-22 – 2017-04-23 (×2): 750 mg via ORAL
  Filled 2017-04-22 (×2): qty 1

## 2017-04-22 MED ORDER — TIZANIDINE HCL 4 MG PO TABS
4.0000 mg | ORAL_TABLET | Freq: Every day | ORAL | Status: DC
  Administered 2017-04-22: 4 mg via ORAL
  Filled 2017-04-22: qty 1

## 2017-04-22 MED ORDER — COQ10 200 MG PO CAPS: 1.0000 | ORAL_CAPSULE | Freq: Every day | ORAL | Status: DC

## 2017-04-22 MED ORDER — ALUM & MAG HYDROXIDE-SIMETH 200-200-20 MG/5ML PO SUSP: 30.0000 mL | Freq: Four times a day (QID) | ORAL | Status: DC | PRN

## 2017-04-22 MED ORDER — SUGAMMADEX SODIUM 200 MG/2ML IV SOLN
INTRAVENOUS | Status: DC | PRN
  Administered 2017-04-22: 200 mg via INTRAVENOUS

## 2017-04-22 MED ORDER — MAGNESIUM 400 MG PO TABS: 1.0000 | ORAL_TABLET | Freq: Every day | ORAL | Status: DC

## 2017-04-22 MED ORDER — LACTATED RINGERS IV SOLN
INTRAVENOUS | Status: DC
  Administered 2017-04-22: 10:00:00 via INTRAVENOUS

## 2017-04-22 MED ORDER — HYDROCODONE-ACETAMINOPHEN 10-325 MG PO TABS
1.0000 | ORAL_TABLET | ORAL | Status: DC | PRN
  Administered 2017-04-22: 1 via ORAL
  Filled 2017-04-22: qty 1

## 2017-04-22 MED ORDER — OXYCODONE HCL 5 MG PO TABS
5.0000 mg | ORAL_TABLET | ORAL | Status: DC | PRN
  Administered 2017-04-22 – 2017-04-23 (×5): 10 mg via ORAL
  Filled 2017-04-22 (×4): qty 2

## 2017-04-22 MED ORDER — PHENYLEPHRINE 40 MCG/ML (10ML) SYRINGE FOR IV PUSH (FOR BLOOD PRESSURE SUPPORT)
PREFILLED_SYRINGE | INTRAVENOUS | Status: AC
  Filled 2017-04-22: qty 10

## 2017-04-22 MED ORDER — PHENOL 1.4 % MT LIQD: 1.0000 | OROMUCOSAL | Status: DC | PRN

## 2017-04-22 MED ORDER — GABAPENTIN 800 MG PO TABS
800.0000 mg | ORAL_TABLET | Freq: Every day | ORAL | Status: DC
  Administered 2017-04-22: 800 mg via ORAL
  Filled 2017-04-22: qty 1

## 2017-04-22 MED ORDER — LIDOCAINE HCL (CARDIAC) 20 MG/ML IV SOLN
INTRAVENOUS | Status: DC | PRN
  Administered 2017-04-22: 40 mg via INTRAVENOUS

## 2017-04-22 MED ORDER — ONDANSETRON HCL 4 MG PO TABS: 4.0000 mg | ORAL_TABLET | Freq: Four times a day (QID) | ORAL | Status: DC | PRN

## 2017-04-22 MED ORDER — HYDROXYCHLOROQUINE SULFATE 200 MG PO TABS
400.0000 mg | ORAL_TABLET | Freq: Every day | ORAL | Status: DC
  Administered 2017-04-23: 400 mg via ORAL
  Filled 2017-04-22: qty 2

## 2017-04-22 MED ORDER — TURMERIC CURCUMIN 500 MG PO CAPS: 1.0000 | ORAL_CAPSULE | Freq: Every day | ORAL | Status: DC

## 2017-04-22 MED ORDER — LEVOTHYROXINE SODIUM 175 MCG PO TABS
175.0000 ug | ORAL_TABLET | Freq: Every day | ORAL | Status: DC
  Administered 2017-04-23: 175 ug via ORAL
  Filled 2017-04-22: qty 1

## 2017-04-22 MED ORDER — LIDOCAINE 2% (20 MG/ML) 5 ML SYRINGE
INTRAMUSCULAR | Status: AC
  Filled 2017-04-22: qty 5

## 2017-04-22 MED ORDER — LIDOCAINE-EPINEPHRINE 1 %-1:100000 IJ SOLN
INTRAMUSCULAR | Status: AC
  Filled 2017-04-22: qty 1

## 2017-04-22 MED ORDER — PROPOFOL 10 MG/ML IV BOLUS
INTRAVENOUS | Status: DC | PRN
  Administered 2017-04-22: 120 mg via INTRAVENOUS

## 2017-04-22 MED ORDER — FENTANYL CITRATE (PF) 250 MCG/5ML IJ SOLN
INTRAMUSCULAR | Status: AC
  Filled 2017-04-22: qty 5

## 2017-04-22 MED ORDER — CHLORHEXIDINE GLUCONATE CLOTH 2 % EX PADS: 6.0000 | MEDICATED_PAD | Freq: Once | CUTANEOUS | Status: DC

## 2017-04-22 MED ORDER — FENTANYL CITRATE (PF) 100 MCG/2ML IJ SOLN
25.0000 ug | INTRAMUSCULAR | Status: DC | PRN
  Administered 2017-04-22: 50 ug via INTRAVENOUS

## 2017-04-22 MED ORDER — MORPHINE SULFATE (PF) 4 MG/ML IV SOLN: 1.0000 mg | INTRAVENOUS | Status: DC | PRN

## 2017-04-22 MED ORDER — IOPAMIDOL (ISOVUE-300) INJECTION 61%
INTRAVENOUS | Status: AC
  Filled 2017-04-22: qty 50

## 2017-04-22 MED ORDER — ESCITALOPRAM OXALATE 10 MG PO TABS
10.0000 mg | ORAL_TABLET | Freq: Every day | ORAL | Status: DC
Start: 2017-04-23 — End: 2017-04-23
  Administered 2017-04-23: 10 mg via ORAL
  Filled 2017-04-22: qty 1

## 2017-04-22 MED ORDER — ONDANSETRON HCL 4 MG/2ML IJ SOLN
INTRAMUSCULAR | Status: DC | PRN
  Administered 2017-04-22: 4 mg via INTRAVENOUS

## 2017-04-22 MED ORDER — IOPAMIDOL (ISOVUE-300) INJECTION 61%
INTRAVENOUS | Status: DC | PRN
  Administered 2017-04-22: 50 mL via INTRAVENOUS

## 2017-04-22 MED ORDER — ACETAMINOPHEN 650 MG RE SUPP: 650.0000 mg | RECTAL | Status: DC | PRN

## 2017-04-22 MED ORDER — ROCURONIUM BROMIDE 10 MG/ML (PF) SYRINGE
PREFILLED_SYRINGE | INTRAVENOUS | Status: AC
  Filled 2017-04-22: qty 5

## 2017-04-22 MED ORDER — OXYCODONE HCL 5 MG PO TABS
ORAL_TABLET | ORAL | Status: AC
  Filled 2017-04-22: qty 2

## 2017-04-22 MED ORDER — MENTHOL 3 MG MT LOZG: 1.0000 | LOZENGE | OROMUCOSAL | Status: DC | PRN

## 2017-04-22 MED ORDER — PANTOPRAZOLE SODIUM 40 MG PO TBEC
40.0000 mg | DELAYED_RELEASE_TABLET | Freq: Every day | ORAL | Status: DC
  Administered 2017-04-22: 40 mg via ORAL
  Filled 2017-04-22: qty 1

## 2017-04-22 MED ORDER — SULFASALAZINE 500 MG PO TABS
1000.0000 mg | ORAL_TABLET | Freq: Every day | ORAL | Status: DC
  Administered 2017-04-23: 1000 mg via ORAL
  Filled 2017-04-22: qty 2

## 2017-04-22 MED ORDER — METOCLOPRAMIDE HCL 5 MG PO TABS: 5.0000 mg | ORAL_TABLET | Freq: Three times a day (TID) | ORAL | Status: DC | PRN

## 2017-04-22 MED ORDER — FENTANYL CITRATE (PF) 100 MCG/2ML IJ SOLN
INTRAMUSCULAR | Status: AC
  Filled 2017-04-22: qty 2

## 2017-04-22 MED ORDER — TRIAMTERENE-HCTZ 37.5-25 MG PO CAPS
1.0000 | ORAL_CAPSULE | Freq: Every day | ORAL | Status: DC
  Filled 2017-04-22 (×2): qty 1

## 2017-04-22 MED ORDER — ROCURONIUM BROMIDE 100 MG/10ML IV SOLN
INTRAVENOUS | Status: DC | PRN
  Administered 2017-04-22: 50 mg via INTRAVENOUS

## 2017-04-22 MED ORDER — PROPOFOL 10 MG/ML IV BOLUS
INTRAVENOUS | Status: AC
  Filled 2017-04-22: qty 20

## 2017-04-22 MED ORDER — SODIUM CHLORIDE 0.9% FLUSH: 3.0000 mL | Freq: Two times a day (BID) | INTRAVENOUS | Status: DC

## 2017-04-22 MED ORDER — FLEET ENEMA 7-19 GM/118ML RE ENEM: 1.0000 | ENEMA | Freq: Once | RECTAL | Status: DC | PRN

## 2017-04-22 MED ORDER — 0.9 % SODIUM CHLORIDE (POUR BTL) OPTIME
TOPICAL | Status: DC | PRN
  Administered 2017-04-22: 1000 mL

## 2017-04-22 MED ORDER — OXYCODONE-ACETAMINOPHEN 10-325 MG PO TABS: 0.5000 | ORAL_TABLET | Freq: Four times a day (QID) | ORAL | Status: DC | PRN

## 2017-04-22 MED ORDER — ACETAMINOPHEN 325 MG PO TABS
650.0000 mg | ORAL_TABLET | ORAL | Status: DC | PRN
  Administered 2017-04-22: 650 mg via ORAL
  Filled 2017-04-22: qty 2

## 2017-04-22 MED ORDER — MAGNESIUM OXIDE 400 (241.3 MG) MG PO TABS
400.0000 mg | ORAL_TABLET | Freq: Every day | ORAL | Status: DC
  Administered 2017-04-23: 400 mg via ORAL
  Filled 2017-04-22: qty 1

## 2017-04-22 MED ORDER — LATANOPROST 0.005 % OP SOLN
1.0000 [drp] | Freq: Every day | OPHTHALMIC | Status: DC
  Administered 2017-04-22: 1 [drp] via OPHTHALMIC
  Filled 2017-04-22: qty 2.5

## 2017-04-22 MED ORDER — MUPIROCIN 2 % EX OINT
TOPICAL_OINTMENT | Freq: Once | CUTANEOUS | Status: AC
  Administered 2017-04-22: 10:00:00 via NASAL
  Filled 2017-04-22: qty 22

## 2017-04-22 MED ORDER — ONDANSETRON HCL 4 MG/2ML IJ SOLN
INTRAMUSCULAR | Status: AC
  Filled 2017-04-22: qty 2

## 2017-04-22 MED ORDER — VITAMIN D (ERGOCALCIFEROL) 1.25 MG (50000 UNIT) PO CAPS: 50000.0000 [IU] | ORAL_CAPSULE | ORAL | Status: DC

## 2017-04-22 MED ORDER — PANTOPRAZOLE SODIUM 40 MG IV SOLR: 40.0000 mg | Freq: Every day | INTRAVENOUS | Status: DC

## 2017-04-22 MED ORDER — BUPIVACAINE HCL (PF) 0.5 % IJ SOLN
INTRAMUSCULAR | Status: DC | PRN
  Administered 2017-04-22: 5 mL

## 2017-04-22 MED ORDER — LIDOCAINE-EPINEPHRINE 1 %-1:100000 IJ SOLN
INTRAMUSCULAR | Status: DC | PRN
Start: 2017-04-22 — End: 2017-04-22
  Administered 2017-04-22: 5 mL

## 2017-04-22 MED ORDER — FENTANYL CITRATE (PF) 100 MCG/2ML IJ SOLN
INTRAMUSCULAR | Status: DC | PRN
  Administered 2017-04-22 (×2): 50 ug via INTRAVENOUS
  Administered 2017-04-22: 100 ug via INTRAVENOUS
  Administered 2017-04-22: 50 ug via INTRAVENOUS

## 2017-04-22 MED ORDER — ONDANSETRON HCL 4 MG/2ML IJ SOLN
4.0000 mg | Freq: Four times a day (QID) | INTRAMUSCULAR | Status: DC | PRN
  Administered 2017-04-22: 4 mg via INTRAVENOUS
  Filled 2017-04-22: qty 2

## 2017-04-22 MED ORDER — ZOLPIDEM TARTRATE 5 MG PO TABS
5.0000 mg | ORAL_TABLET | Freq: Every day | ORAL | Status: DC
Start: 2017-04-22 — End: 2017-04-23
  Administered 2017-04-22: 5 mg via ORAL
  Filled 2017-04-22: qty 1

## 2017-04-22 MED ORDER — KCL IN DEXTROSE-NACL 20-5-0.45 MEQ/L-%-% IV SOLN: INTRAVENOUS | Status: DC

## 2017-04-22 MED ORDER — DOCUSATE SODIUM 100 MG PO CAPS
100.0000 mg | ORAL_CAPSULE | Freq: Two times a day (BID) | ORAL | Status: DC
  Administered 2017-04-22 – 2017-04-23 (×2): 100 mg via ORAL
  Filled 2017-04-22 (×2): qty 1

## 2017-04-22 MED ORDER — BUPIVACAINE HCL (PF) 0.5 % IJ SOLN
INTRAMUSCULAR | Status: AC
  Filled 2017-04-22: qty 30

## 2017-04-22 MED ORDER — SODIUM CHLORIDE 0.9 % IV SOLN: 250.0000 mL | INTRAVENOUS | Status: DC

## 2017-04-22 MED ORDER — PHENYLEPHRINE HCL 10 MG/ML IJ SOLN
INTRAMUSCULAR | Status: DC | PRN
  Administered 2017-04-22: 80 ug via INTRAVENOUS

## 2017-04-22 MED ORDER — POLYETHYLENE GLYCOL 3350 17 G PO PACK: 17.0000 g | PACK | Freq: Every day | ORAL | Status: DC | PRN

## 2017-04-22 MED ORDER — DEXTROSE 5 % IV SOLN
INTRAVENOUS | Status: DC | PRN
  Administered 2017-04-22: 20 ug/min via INTRAVENOUS

## 2017-04-22 SURGICAL SUPPLY — 46 items
BLADE CLIPPER SURG (BLADE) IMPLANT
BLADE SURG 15 STRL LF DISP TIS (BLADE) ×1 IMPLANT
BLADE SURG 15 STRL SS (BLADE) ×2
CARTRIDGE OIL MAESTRO DRILL (MISCELLANEOUS) IMPLANT
CEMENT BONE KYPHX HV R (Orthopedic Implant) ×3 IMPLANT
CEMENT KYPHON C01A KIT/MIXER (Cement) ×3 IMPLANT
DECANTER SPIKE VIAL GLASS SM (MISCELLANEOUS) ×3 IMPLANT
DERMABOND ADVANCED (GAUZE/BANDAGES/DRESSINGS) ×2
DERMABOND ADVANCED .7 DNX12 (GAUZE/BANDAGES/DRESSINGS) ×1 IMPLANT
DIFFUSER DRILL AIR PNEUMATIC (MISCELLANEOUS) IMPLANT
DRAPE C-ARM 42X72 X-RAY (DRAPES) ×6 IMPLANT
DRAPE HALF SHEET 40X57 (DRAPES) ×3 IMPLANT
DRAPE LAPAROTOMY 100X72X124 (DRAPES) ×3 IMPLANT
DRAPE SURG 17X23 STRL (DRAPES) ×3 IMPLANT
DRAPE WARM FLUID 44X44 (DRAPE) ×3 IMPLANT
DURAPREP 26ML APPLICATOR (WOUND CARE) ×3 IMPLANT
GAUZE SPONGE 4X4 16PLY XRAY LF (GAUZE/BANDAGES/DRESSINGS) ×3 IMPLANT
GLOVE BIO SURGEON STRL SZ8 (GLOVE) ×3 IMPLANT
GLOVE BIOGEL PI IND STRL 7.5 (GLOVE) ×1 IMPLANT
GLOVE BIOGEL PI IND STRL 8.5 (GLOVE) ×1 IMPLANT
GLOVE BIOGEL PI INDICATOR 7.5 (GLOVE) ×2
GLOVE BIOGEL PI INDICATOR 8.5 (GLOVE) ×2
GLOVE EXAM NITRILE LRG STRL (GLOVE) IMPLANT
GLOVE EXAM NITRILE XL STR (GLOVE) IMPLANT
GLOVE EXAM NITRILE XS STR PU (GLOVE) IMPLANT
GLOVE SS BIOGEL STRL SZ 7 (GLOVE) ×1 IMPLANT
GLOVE SUPERSENSE BIOGEL SZ 7 (GLOVE) ×2
GOWN STRL REUS W/ TWL LRG LVL3 (GOWN DISPOSABLE) ×1 IMPLANT
GOWN STRL REUS W/ TWL XL LVL3 (GOWN DISPOSABLE) ×1 IMPLANT
GOWN STRL REUS W/TWL 2XL LVL3 (GOWN DISPOSABLE) IMPLANT
GOWN STRL REUS W/TWL LRG LVL3 (GOWN DISPOSABLE) ×2
GOWN STRL REUS W/TWL XL LVL3 (GOWN DISPOSABLE) ×2
KIT BASIN OR (CUSTOM PROCEDURE TRAY) ×3 IMPLANT
KIT ROOM TURNOVER OR (KITS) ×3 IMPLANT
NEEDLE HYPO 25X1 1.5 SAFETY (NEEDLE) ×3 IMPLANT
NS IRRIG 1000ML POUR BTL (IV SOLUTION) ×3 IMPLANT
OIL CARTRIDGE MAESTRO DRILL (MISCELLANEOUS)
PACK SURGICAL SETUP 50X90 (CUSTOM PROCEDURE TRAY) ×3 IMPLANT
PAD ARMBOARD 7.5X6 YLW CONV (MISCELLANEOUS) ×12 IMPLANT
STAPLER SKIN PROX WIDE 3.9 (STAPLE) ×3 IMPLANT
SUT VIC AB 3-0 SH 8-18 (SUTURE) ×3 IMPLANT
SYR CONTROL 10ML LL (SYRINGE) ×6 IMPLANT
TOWEL GREEN STERILE (TOWEL DISPOSABLE) ×3 IMPLANT
TOWEL GREEN STERILE FF (TOWEL DISPOSABLE) IMPLANT
TRAY KYPHOPAK 15/3 ONESTEP 1ST (MISCELLANEOUS) ×3 IMPLANT
TRAY KYPHOPAK 20/3 ONESTEP 1ST (MISCELLANEOUS) IMPLANT

## 2017-04-22 NOTE — Transfer of Care (Signed)
Immediate Anesthesia Transfer of Care Note  Patient: Danielle Castaneda  Procedure(s) Performed: Procedure(s): Thoracic Eleven Kyphoplasty (N/A)  Patient Location: PACU  Anesthesia Type:General  Level of Consciousness: awake, alert  and oriented  Airway & Oxygen Therapy: Patient Spontanous Breathing and Patient connected to nasal cannula oxygen  Post-op Assessment: Report given to RN, Post -op Vital signs reviewed and stable and Patient moving all extremities X 4  Post vital signs: Reviewed and stable  Last Vitals:  Vitals:   04/22/17 0926  BP: 139/83  Pulse: 71  Resp: 18  Temp: 36.7 C    Last Pain:  Vitals:   04/22/17 1000  TempSrc:   PainSc: 7       Patients Stated Pain Goal: 7 (92/[id number])  Complications: No apparent anesthesia complications

## 2017-04-22 NOTE — H&P (Signed)
Patient ID:   [id number] Patient: Danielle Castaneda  Date of Birth: 1941/10/02 Visit Type: Office Visit   Date: 04/14/2017 11:15 AM Provider: Marchia Meiers. Vertell Limber MD   This 76 year old female presents for back pain.   History of Present Illness: 1.  back pain  Ms. Heft reports persistent, constant lumbar pain radiating into both hips. She reports 25% improvement in lumbar pain since kyphoplasty (was previously 50% improved so pain has worsened). She notes relief when lying down.   T12 Kyphoplasty December 1st, 2017.  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 Facet mediated anterolisthesis, with uncovering of the disc as well as posterior element hypertrophy could affect the L4 and/or L5 nerve roots. Similar appearance to 2017. Status post T12 vertebral augmentation. Extraosseous migration of cement into the T11-12 interspace. Mild bone marrow edema T11, with small anterior inferior corner fracture, uncertain significance.           MEDICATIONS(added, continued or stopped this visit): Started Medication Directions Instruction Stopped   alprazolam 1 mg tablet take 1 tablet by oral route 2 times every day     Ambien 10 mg tablet take 1 tablet by oral route  every day at bedtime     apple cider vinegar 600 mg capsule      biotin 5,000 mcg disintegrating tablet      fish oil Omega 3  ORAL      gabapentin 800 mg tablet take 1 tablet by oral route 3 times every day     hydrocodone 10 mg-acetaminophen 325 mg tablet take 1 tablet by oral route  every 8 hours as needed for pain     latanoprost 0.005 % eye drops instill 1 drop by ophthalmic route  every day into affected eye(s) in the evening     levothyroxine 175 mcg tablet take 1 tablet by oral route  every day     Lexapro 10 mg tablet take 1 tablet by oral route  every day     Plaquenil 200 mg tablet take 1 tablet by oral route  every day     sulfasalazine 500 mg tablet take 2 tablet by oral route 2 times every day after meals      tizanidine 4 mg capsule take 1 capsule by oral route 3 times every day     triamterene 37.5 mg-hydrochlorothiazide 25 mg tablet take 1 tablet by oral route  every day       ALLERGIES: Ingredient Reaction Medication Name Comment  LEVOFLOXACIN  Levaquin   TETRACYCLINE      Reviewed, no changes.    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    Assessment/Plan # Detail Type Description   1. Assessment Wedge compression fracture of T11-T12 vertebra, init (S22.080A).           Pain Management Plan Pain Scale: 7/10. Method: Numeric Pain Intensity Scale. Location: lower back and hip. Pain management follow-up plan of care: Patient taking medication as prescribed..  Fall Risk Plan The patient has not fallen in the last year.  nurse education given. scheduled T11 kyphoplasty.   Orders: Diagnostic Procedures: Assessment Procedure  S22.080A Thoraco-lumbar Spine- AP/Lat  Provider:  Vertell Limber MD, Marchia Meiers 04/14/2017 1:05 PM  Dictation edited by: Dionne Bucy    CC Providers: [place]  [phone]   Erline Levine MD  [address] [place], [place] [address]              Electronically signed by Marchia Meiers. Vertell Limber MD on 04/15/2017 05:39 PM

## 2017-04-22 NOTE — Progress Notes (Signed)
PHARMACIST - PHYSICIAN ORDER COMMUNICATION  CONCERNING: P&T Medication Policy on Herbal Medications  DESCRIPTION:  This patient's order for:  Coenzyme Q10 and Tumeric  has been noted.  This product(s) is classified as an "herbal" or natural product. Due to a lack of definitive safety studies or FDA approval, nonstandard manufacturing practices, plus the potential risk of unknown drug-drug interactions while on inpatient medications, the Pharmacy and Therapeutics Committee does not permit the use of "herbal" or natural products of this type within [facility].   ACTION TAKEN: The pharmacy department is unable to verify this order at this time and your patient has been informed of this safety policy. Please reevaluate patient's clinical condition at discharge and address if the herbal or natural product(s) should be resumed at that time.   Alycia Rossetti, PharmD, BCPS

## 2017-04-22 NOTE — Anesthesia Procedure Notes (Signed)
Procedure Name: Intubation Date/Time: 04/22/2017 12:13 PM Performed by: Rush Farmer E Pre-anesthesia Checklist: Patient identified, Emergency Drugs available, Suction available and Patient being monitored Patient Re-evaluated:Patient Re-evaluated prior to inductionOxygen Delivery Method: Circle system utilized Preoxygenation: Pre-oxygenation with 100% oxygen Intubation Type: IV induction Ventilation: Mask ventilation without difficulty Laryngoscope Size: Mac and 3 Grade View: Grade I Tube type: Oral Tube size: 7.0 mm Number of attempts: 1 Airway Equipment and Method: Stylet Placement Confirmation: ETT inserted through vocal cords under direct vision,  positive ETCO2 and breath sounds checked- equal and bilateral Secured at: 20 cm Tube secured with: Tape Dental Injury: Teeth and Oropharynx as per pre-operative assessment

## 2017-04-22 NOTE — Brief Op Note (Signed)
04/22/2017  1:51 PM  PATIENT:  Danielle Castaneda  76 y.o. female  PRE-OPERATIVE DIAGNOSIS:  Wedge compression fracture of T11 vertebra  POST-OPERATIVE DIAGNOSIS:  Wedge compression fracture of T 11 vertebra  PROCEDURE:  Procedure(s): Thoracic Eleven Kyphoplasty (N/A)  SURGEON:  Surgeon(s) and Role:    Erline Levine, MD - Primary  PHYSICIAN ASSISTANT:   ASSISTANTS: none   ANESTHESIA:   general  EBL:  Total I/O In: 700 [I.V.:700] Out: 5 [Blood:5]  BLOOD ADMINISTERED:none  DRAINS: none   LOCAL MEDICATIONS USED:  MARCAINE    and LIDOCAINE   SPECIMEN:  No Specimen  DISPOSITION OF SPECIMEN:  N/A  COUNTS:  YES  TOURNIQUET:  * No tourniquets in log *  DICTATION: DICTATION: Patient is 76 year old man with scoliosis and osteoporosis and previously treated T 12 compression fracture.  She has now developed a painful T 11 compression fracture, which is proving debilitatingly painful to her.  It was elected to take her to surgery for kyphoplasty procedure.  PROCEDURE:  Following the smooth and uncomplicated induction of general endotracheal anesthesia, the patient was placed in a prone position on chest rolls.  C-arm fluoroscopy was positioned in both the AP and lateral planes, centered on the T 11 vertebra.  Her back was prepped and draped in the usual sterile fashion with Duraprep.  Using bi-pedicular approach, bilateral pedicles and vertebral body were entered with the trochar using standard landmarks.  The drill was used, followed by a 15 cc Kyphon balloon, which was used to re-expand the broken vertebra.  Subsequently, 6 cc of bone cement was placed into the void created by the balloons and was seen to fill the fracture cleft and fill the vertebra in both the AP and lateral direction with good interdigitation and without apparent extravasation. The bone void fillers were then removed.  Final X-ray demonstrated good filling within the fractured vertebra.  The incision was closed with two  single 3-0 vicryl stitches and dressed with Dermabond. The patient was returned to the [facility] and extubated in the OR and taken to Recovery in stable and satisfactory condition, having tolerated the procedure well.  Counts were correct at the end of the case.   PLAN OF CARE: Admit for overnight observation  PATIENT DISPOSITION:  PACU - hemodynamically stable.   Delay start of Pharmacological VTE agent (>24hrs) due to surgical blood loss or risk of bleeding: yes

## 2017-04-22 NOTE — Op Note (Signed)
04/22/2017  1:51 PM  PATIENT:  Danielle Castaneda  76 y.o. female  PRE-OPERATIVE DIAGNOSIS:  Wedge compression fracture of T11 vertebra  POST-OPERATIVE DIAGNOSIS:  Wedge compression fracture of T 11 vertebra  PROCEDURE:  Procedure(s): Thoracic Eleven Kyphoplasty (N/A)  SURGEON:  Surgeon(s) and Role:    Erline Levine, MD - Primary  PHYSICIAN ASSISTANT:   ASSISTANTS: none   ANESTHESIA:   general  EBL:  Total I/O In: 700 [I.V.:700] Out: 5 [Blood:5]  BLOOD ADMINISTERED:none  DRAINS: none   LOCAL MEDICATIONS USED:  MARCAINE    and LIDOCAINE   SPECIMEN:  No Specimen  DISPOSITION OF SPECIMEN:  N/A  COUNTS:  YES  TOURNIQUET:  * No tourniquets in log *  DICTATION: DICTATION: Patient is 76 year old man with scoliosis and osteoporosis and previously treated T 12 compression fracture.  She has now developed a painful T 11 compression fracture, which is proving debilitatingly painful to her.  It was elected to take her to surgery for kyphoplasty procedure.  PROCEDURE:  Following the smooth and uncomplicated induction of general endotracheal anesthesia, the patient was placed in a prone position on chest rolls.  C-arm fluoroscopy was positioned in both the AP and lateral planes, centered on the T 11 vertebra.  Her back was prepped and draped in the usual sterile fashion with Duraprep.  Using bi-pedicular approach, bilateral pedicles and vertebral body were entered with the trochar using standard landmarks.  The drill was used, followed by a 15 cc Kyphon balloon, which was used to re-expand the broken vertebra.  Subsequently, 6 cc of bone cement was placed into the void created by the balloons and was seen to fill the fracture cleft and fill the vertebra in both the AP and lateral direction with good interdigitation and without apparent extravasation. The bone void fillers were then removed.  Final X-ray demonstrated good filling within the fractured vertebra.  The incision was closed with two  single 3-0 vicryl stitches and dressed with Dermabond. The patient was returned to the [facility] and extubated in the OR and taken to Recovery in stable and satisfactory condition, having tolerated the procedure well.  Counts were correct at the end of the case.   PLAN OF CARE: Admit for overnight observation  PATIENT DISPOSITION:  PACU - hemodynamically stable.   Delay start of Pharmacological VTE agent (>24hrs) due to surgical blood loss or risk of bleeding: yes

## 2017-04-22 NOTE — Anesthesia Preprocedure Evaluation (Addendum)
Anesthesia Evaluation  Patient identified by MRN, date of birth, ID band Patient awake    Reviewed: Allergy & Precautions, NPO status , Patient's Chart, lab work & pertinent test results  History of Anesthesia Complications Negative for: history of anesthetic complications  Airway Mallampati: I  TM Distance: >3 FB Neck ROM: Full    Dental  (+) Teeth Intact, Caps, Dental Advisory Given   Pulmonary neg pulmonary ROS,    breath sounds clear to auscultation       Cardiovascular hypertension, Pt. on medications  Rhythm:Regular Rate:Normal     Neuro/Psych Chronic back pain T11 compression fracture    GI/Hepatic Neg liver ROS, GERD  Controlled,  Endo/Other  Hypothyroidism SLE  Renal/GU negative Renal ROS     Musculoskeletal  (+) Arthritis , Rheumatoid disorders,    Abdominal   Peds  Hematology negative hematology ROS (+)   Anesthesia Other Findings H/o breast cancer  Reproductive/Obstetrics                           Anesthesia Physical Anesthesia Plan  ASA: III  Anesthesia Plan: General   Post-op Pain Management:    Induction: Intravenous  PONV Risk Score and Plan: 4 or greater and Ondansetron, Dexamethasone, Propofol, Midazolam and Treatment may vary due to age or medical condition  Airway Management Planned: Oral ETT  Additional Equipment:   Intra-op Plan:   Post-operative Plan: Extubation in OR  Informed Consent: I have reviewed the patients History and Physical, chart, labs and discussed the procedure including the risks, benefits and alternatives for the proposed anesthesia with the patient or authorized representative who has indicated his/her understanding and acceptance.   Dental advisory given  Plan Discussed with: CRNA and Surgeon  Anesthesia Plan Comments: (Plan routine monitors, GETA)        Anesthesia Quick Evaluation

## 2017-04-22 NOTE — Interval H&P Note (Signed)
History and Physical Interval Note:  04/22/2017 9:53 AM  Danielle Castaneda  has presented today for surgery, with the diagnosis of Wedge compression fracture of T11-12 vertebra  The various methods of treatment have been discussed with the patient and family. After consideration of risks, benefits and other options for treatment, the patient has consented to  Procedure(s) with comments: T11 Kyphoplasty (N/A) - T11 Kyphoplasty as a surgical intervention .  The patient's history has been reviewed, patient examined, no change in status, stable for surgery.  I have reviewed the patient's chart and labs.  Questions were answered to the patient's satisfaction.     [name] D

## 2017-04-22 NOTE — Anesthesia Postprocedure Evaluation (Signed)
Anesthesia Post Note  Patient: Danielle Castaneda  Procedure(s) Performed: Procedure(s) (LRB): Thoracic Eleven Kyphoplasty (N/A)     Patient location during evaluation: PACU Anesthesia Type: General Level of consciousness: awake and alert, oriented and patient cooperative Pain management: pain level controlled Vital Signs Assessment: post-procedure vital signs reviewed and stable Respiratory status: spontaneous breathing, nonlabored ventilation and respiratory function stable Cardiovascular status: blood pressure returned to baseline and stable Postop Assessment: no signs of nausea or vomiting Anesthetic complications: no    Last Vitals:  Vitals:   04/22/17 1432 04/22/17 1615  BP: (!) 148/70 (!) 114/56  Pulse: 72 72  Resp: 20 18  Temp: 36.9 C 37.2 C    Last Pain:  Vitals:   04/22/17 1615  TempSrc: Oral  PainSc:                  Collette Pescador,E. Sonali Wivell

## 2017-04-22 NOTE — Evaluation (Signed)
Physical Therapy Evaluation Patient Details Name: Danielle Castaneda MRN: [id number] DOB: 1941-10-23 Today's Date: 04/22/2017   History of Present Illness  Pt is a 76 y/o female s/p T11 Kyphoplasty. PMH includes wedge compression fxs at T11-12 s/p T12 kyphoplasty in December, HTN, breast cancer, arthritis, and hypothyroidism.   Clinical Impression  Patient is s/p above surgery resulting in the deficits listed below (see PT Problem List). PTA, pt was independent with ambulation. Upon evaluation, pt limited by post op pain, weakness, and decreased balance. Required min A to min guard A for functional mobility and use of IV pole for steadying. Will need to assess steadiness in next session to determine DME usage at home.  Pt will have necessary assist at home and has all necessary DME at home. Patient will benefit from skilled PT to increase their independence and safety with mobility (while adhering to their precautions) to allow discharge to the venue listed below. Will continue to follow acutely.      Follow Up Recommendations No PT follow up;Supervision/Assistance - 24 hour    Equipment Recommendations  None recommended by PT (has all DME needed)    Recommendations for Other Services       Precautions / Restrictions Precautions Precautions: Back Precaution Booklet Issued: Yes (comment) Precaution Comments: Reviewed back precaution handout with pt.  Restrictions Weight Bearing Restrictions: No      Mobility  Bed Mobility Overal bed mobility: Needs Assistance Bed Mobility: Rolling;Sidelying to Sit;Sit to Sidelying Rolling: Min guard Sidelying to sit: Min assist     Sit to sidelying: Min assist General bed mobility comments: Min A for trunk assist with sit<>sidelying transfer. Verbal cues for use of log roll technique. Min guard during rolling to ensure maintenance of precautions.   Transfers Overall transfer level: Needs assistance Equipment used: None Transfers: Sit to/from  Stand Sit to Stand: Min assist         General transfer comment: Min A for steadying during transfer.   Ambulation/Gait Ambulation/Gait assistance: Min guard Ambulation Distance (Feet): 125 Feet Assistive device:  (Use of IV pole ) Gait Pattern/deviations: Step-through pattern;Decreased stride length;Shuffle Gait velocity: Decreased Gait velocity interpretation: Below normal speed for age/gender General Gait Details: Slow, mildly unsteady gait. Requiring use of IV pole for steadying. No LOB noted, however, requiring multiple standing rest breaks secondary to pain. Pt limited in distance secondary to pain.   Stairs            Wheelchair Mobility    Modified Rankin (Stroke Patients Only)       Balance Overall balance assessment: Needs assistance Sitting-balance support: No upper extremity supported;Feet supported Sitting balance-Leahy Scale: Good     Standing balance support: Single extremity supported;During functional activity Standing balance-Leahy Scale: Poor Standing balance comment: Reliant on external support for balance                              Pertinent Vitals/Pain Pain Assessment: 0-10 Pain Score: 6  Pain Location: back  Pain Descriptors / Indicators: Aching;Sore;Operative site guarding Pain Intervention(s): Limited activity within patient's tolerance;Monitored during session;Repositioned    Home Living Family/patient expects to be discharged to:: Private residence Living Arrangements: Spouse/significant other Available Help at Discharge: Family;Available 24 hours/day Type of Home: Other(Comment) (townhome ) Home Access: Stairs to enter Entrance Stairs-Rails: None Entrance Stairs-Number of Steps: curb step and threshold step into house  Home Layout: One level Home Equipment: Shower seat;Hand held Tourist information centre manager - 2  wheels;Cane - single point      Prior Function Level of Independence: Independent               Hand  Dominance   Dominant Hand: Right    Extremity/Trunk Assessment   Upper Extremity Assessment Upper Extremity Assessment: Defer to OT evaluation    Lower Extremity Assessment Lower Extremity Assessment: Generalized weakness (Grossly 3+/5 throughout )    Cervical / Trunk Assessment Cervical / Trunk Assessment: Other exceptions Cervical / Trunk Exceptions: s/p back surgery   Communication   Communication: No difficulties  Cognition Arousal/Alertness: Awake/alert Behavior During Therapy: WFL for tasks assessed/performed Overall Cognitive Status: Within Functional Limits for tasks assessed                                        General Comments General comments (skin integrity, edema, etc.): Pt's daughter and husband present during session. Educated about generalized walking program to perform at home.     Exercises     Assessment/Plan    PT Assessment Patient needs continued PT services  PT Problem List Decreased strength;Decreased activity tolerance;Decreased balance;Decreased mobility;Decreased knowledge of use of DME;Decreased knowledge of precautions;Pain       PT Treatment Interventions DME instruction;Gait training;Stair training;Functional mobility training;Therapeutic activities;Therapeutic exercise;Balance training;Neuromuscular re-education;Patient/family education    PT Goals (Current goals can be found in the Care Plan section)  Acute Rehab PT Goals Patient Stated Goal: to get back to doing housework and other things outside of home PT Goal Formulation: With patient Time For Goal Achievement: 04/29/17 Potential to Achieve Goals: Good    Frequency Min 5X/week   Barriers to discharge        Co-evaluation               AM-PAC PT "6 Clicks" Daily Activity  Outcome Measure Difficulty turning over in bed (including adjusting bedclothes, sheets and blankets)?: Total Difficulty moving from lying on back to sitting on the side of the bed? :  Total Difficulty sitting down on and standing up from a chair with arms (e.g., wheelchair, bedside commode, etc,.)?: Total Help needed moving to and from a bed to chair (including a wheelchair)?: A Little Help needed walking in hospital room?: A Little Help needed climbing 3-5 steps with a railing? : A Lot 6 Click Score: 11    End of Session Equipment Utilized During Treatment: Gait belt Activity Tolerance: Patient limited by pain Patient left: in bed;with call bell/phone within reach;with family/visitor present Nurse Communication: Mobility status PT Visit Diagnosis: Other abnormalities of gait and mobility (R26.89);Pain Pain - part of body:  (back )    Time: 6063-0160 PT Time Calculation (min) (ACUTE ONLY): 24 min   Charges:   PT Evaluation $PT Eval Low Complexity: 1 Procedure PT Treatments $Gait Training: 8-22 mins   PT G Codes:        Leighton Ruff, PT, DPT  Acute Rehabilitation Services  Pager: [phone]   Rudean Hitt 04/22/2017, 4:53 PM

## 2017-04-23 ENCOUNTER — Encounter (HOSPITAL_COMMUNITY): Payer: Self-pay | Admitting: Neurosurgery

## 2017-04-23 DIAGNOSIS — M4854XA Collapsed vertebra, not elsewhere classified, thoracic region, initial encounter for fracture: Secondary | ICD-10-CM | POA: Diagnosis not present

## 2017-04-23 NOTE — Discharge Summary (Signed)
Physician Discharge Summary  Patient ID: Danielle Castaneda MRN: [id number] DOB/AGE: [birth year] 76 y.o.  Admit date: 04/22/2017 Discharge date: 04/23/2017  Admission Diagnoses:T 11 compression fracture  Discharge Diagnoses: Same Active Problems:   Closed wedge compression fracture of eleventh thoracic vertebra [facility])   Discharged Condition: good  Hospital Course: Patient underwent T 11 kyphoplasty for painful compression fracture.  She did well postoperatively with significant improvement in pain.  Consults: None  Significant Diagnostic Studies: None  Treatments: surgery: T 11 kyphoplasty for painful compression fracture  Discharge Exam: Blood pressure (!) 91/52, pulse 65, temperature 98.3 F (36.8 C), resp. rate 18, height 5\' 4"  (1.626 m), weight 74.4 kg (164 lb), SpO2 97 %. Neurologic: Alert and oriented X 3, normal strength and tone. Normal symmetric reflexes. Normal coordination and gait Wound:CDI  Disposition: Home  Discharge Instructions    Diet - low sodium heart healthy    Complete by:  As directed    Increase activity slowly    Complete by:  As directed      Allergies as of 04/23/2017      Reactions   Levaquin [levofloxacin]    Aching and joint pain   Tetracyclines & Related Rash      Medication List    TAKE these medications   ALPRAZolam 1 MG tablet Commonly known as:  XANAX Take 0.5 mg by mouth at bedtime.   cetirizine 10 MG tablet Commonly known as:  ZYRTEC Take 10 mg by mouth daily as needed for allergies.   CoQ10 200 MG Caps Take 1 capsule by mouth daily.   escitalopram 20 MG tablet Commonly known as:  LEXAPRO Take 10 mg by mouth daily.   gabapentin 800 MG tablet Commonly known as:  NEURONTIN Take 800 mg by mouth at bedtime.   HYDROcodone-acetaminophen 10-325 MG tablet Commonly known as:  NORCO Take 0.5 tablets by mouth every 6 (six) hours as needed for pain.   hydroxychloroquine 200 MG tablet Commonly known as:  PLAQUENIL Take 400 mg by  mouth daily.   latanoprost 0.005 % ophthalmic solution Commonly known as:  XALATAN Place 1 drop into both eyes at bedtime.   levothyroxine 175 MCG tablet Commonly known as:  SYNTHROID, LEVOTHROID Take 175 mcg by mouth daily before breakfast.   Magnesium 400 MG Tabs Take 1 tablet by mouth daily.   methocarbamol 750 MG tablet Commonly known as:  ROBAXIN Take 1 tablet (750 mg total) by mouth every 6 (six) hours as needed for muscle spasms.   metoCLOPramide 5 MG tablet Commonly known as:  REGLAN Take 5 mg by mouth 3 (three) times daily as needed for nausea.   Omega-3 Krill Oil 500 MG Caps Take by mouth.   oxyCODONE-acetaminophen 10-325 MG tablet Commonly known as:  PERCOCET Take 0.5-1 tablets by mouth every 6 (six) hours as needed for pain.   sulfaSALAzine 500 MG tablet Commonly known as:  AZULFIDINE Take 1,000 mg by mouth daily.   tiZANidine 4 MG tablet Commonly known as:  ZANAFLEX Take 4 mg by mouth at bedtime.   triamcinolone cream 0.1 % Commonly known as:  KENALOG Apply 1 application topically 2 (two) times daily as needed (itching).   triamterene-hydrochlorothiazide 37.5-25 MG capsule Commonly known as:  DYAZIDE Take 1 capsule by mouth daily.   Turmeric Curcumin 500 MG Caps Take 1 capsule by mouth daily. With 50 mg of Ginger   Vitamin D (Ergocalciferol) 50000 units Caps capsule Commonly known as:  DRISDOL Take 50,000 Units by mouth every 7 (seven)  days. Saturdays   zolpidem 10 MG tablet Commonly known as:  AMBIEN Take 5 mg by mouth at bedtime. And if pt is unable to fall asleep will take the other half of the tablet        Signed: Yousif Edelson D, MD 04/23/2017, 8:16 AM

## 2017-04-23 NOTE — Progress Notes (Signed)
Physical Therapy Treatment Patient Details Name: Danielle Castaneda MRN: [id number] DOB: 22-Aug-1941 Today's Date: 04/23/2017    History of Present Illness Pt is a 76 y/o female s/p T11 Kyphoplasty. PMH includes wedge compression fxs at T11-12 s/p T12 kyphoplasty in December, HTN, breast cancer, arthritis, and hypothyroidism.     PT Comments    Pt progressing well and has great home set up and support. Pt functioning at supervision level with exception of stair negotiation at min guard. Acute PT to con't to follow to progress indep.   Follow Up Recommendations  No PT follow up;Supervision/Assistance - 24 hour     Equipment Recommendations  None recommended by PT    Recommendations for Other Services       Precautions / Restrictions Precautions Precautions: Back Precaution Booklet Issued: Yes (comment) Precaution Comments: Reviewed back precautions during ADL. Pt able to recall 3/3. Handout previously provided by PT.  Restrictions Weight Bearing Restrictions: No    Mobility  Bed Mobility Overal bed mobility: Needs Assistance Bed Mobility: Rolling;Sit to Sidelying Rolling: Supervision Sidelying to sit: Supervision;HOB elevated     Sit to sidelying: Supervision;HOB elevated General bed mobility comments: Pt with report that her bed has adjustable head and legs.  Transfers Overall transfer level: Needs assistance Equipment used: None Transfers: Sit to/from Stand Sit to Stand: Supervision         General transfer comment: increased time but pushed up from bed and BSC well  Ambulation/Gait Ambulation/Gait assistance: Min guard Ambulation Distance (Feet): 200 Feet Assistive device: None (but ran hands along ledge in hallway) Gait Pattern/deviations: Step-through pattern;Decreased stride length Gait velocity: dec Gait velocity interpretation: Below normal speed for age/gender General Gait Details: slow and guarded, rigid   Stairs Stairs: Yes   Stair Management: One  rail Left;Step to pattern Number of Stairs: 2 (x2 trials) General stair comments: pt with good technique, instructed to contract abd muscles upon ascend/descend to help stabilize the back  Wheelchair Mobility    Modified Rankin (Stroke Patients Only)       Balance Overall balance assessment: Needs assistance Sitting-balance support: No upper extremity supported;Feet supported Sitting balance-Leahy Scale: Good     Standing balance support: Single extremity supported;During functional activity Standing balance-Leahy Scale: Fair Standing balance comment: pt able to stand and perform hygiene s/p urination                            Cognition Arousal/Alertness: Awake/alert Behavior During Therapy: WFL for tasks assessed/performed Overall Cognitive Status: Within Functional Limits for tasks assessed                                        Exercises      General Comments General comments (skin integrity, edema, etc.): Pt's sister present during session.       Pertinent Vitals/Pain Pain Assessment: 0-10 Pain Score: 4  Pain Location: back Pain Descriptors / Indicators: Sore Pain Intervention(s): Monitored during session    Home Living Family/patient expects to be discharged to:: Private residence Living Arrangements: Spouse/significant other Available Help at Discharge: Family;Available 24 hours/day Type of Home: Other(Comment) (townhome) Home Access: Stairs to enter Entrance Stairs-Rails: None Home Layout: One level Home Equipment: Shower seat;Hand held Tourist information centre manager - 2 wheels;Cane - single point      Prior Function Level of Independence: Independent  PT Goals (current goals can now be found in the care plan section) Acute Rehab PT Goals Patient Stated Goal: to get back to going to church Progress towards PT goals: Progressing toward goals    Frequency    Min 5X/week      PT Plan Current plan remains appropriate     Co-evaluation              AM-PAC PT "6 Clicks" Daily Activity  Outcome Measure  Difficulty turning over in bed (including adjusting bedclothes, sheets and blankets)?: A Little Difficulty moving from lying on back to sitting on the side of the bed? : A Little Difficulty sitting down on and standing up from a chair with arms (e.g., wheelchair, bedside commode, etc,.)?: A Little Help needed moving to and from a bed to chair (including a wheelchair)?: A Little Help needed walking in hospital room?: A Little Help needed climbing 3-5 steps with a railing? : A Little 6 Click Score: 18    End of Session Equipment Utilized During Treatment: Gait belt Activity Tolerance: Patient tolerated treatment well Patient left: in bed;with call bell/phone within reach;with family/visitor present Nurse Communication: Mobility status PT Visit Diagnosis: Other abnormalities of gait and mobility (R26.89);Pain Pain - part of body:  (back)     Time: 8916-9450 PT Time Calculation (min) (ACUTE ONLY): 20 min  Charges:                       G Codes:       Kittie Plater, PT, DPT Pager #: [phone] Office #: [phone]    [place] 04/23/2017, 9:30 AM

## 2017-04-23 NOTE — Care Management Obs Status (Signed)
MEDICARE OBSERVATION STATUS NOTIFICATION   Patient Details  Name: Danielle Castaneda MRN: [id number] Date of Birth: July 17, 1941   Medicare Observation Status Notification Given:  Yes    Ninfa Meeker, RN 04/23/2017, 9:00 AM

## 2017-04-23 NOTE — Discharge Instructions (Signed)

## 2017-04-23 NOTE — Evaluation (Addendum)
Occupational Therapy Evaluation/Discharge Patient Details Name: Danielle Castaneda MRN: [id number] DOB: 11-08-1940 Today's Date: 04/23/2017    History of Present Illness Pt is a 76 y/o female s/p T11 Kyphoplasty. PMH includes wedge compression fxs at T11-12 s/p T12 kyphoplasty in December, HTN, breast cancer, arthritis, and hypothyroidism.    Clinical Impression   PTA, pt was independent with ADL and functional mobility. Pt currently able to complete toilet transfers and standing grooming tasks with supervision, tub transfers with min guard assist, and LB ADL with min assist. Educated pt on back precautions during ADL and she demonstrates good understanding. Pt additionally educated concerning compensatory strategies and use of AE for LB ADL in order to maximize independence and safety with these tasks. Educated pt and family on fall prevention and home modifications to ensure safety. Pt and family report that husband will be able to provide the necessary assistance. All OT education complete and pt reports no further questions/concerns. No further acute OT needs identified and OT will sign off.     Follow Up Recommendations  No OT follow up;Supervision/Assistance - 24 hour    Equipment Recommendations  3 in 1 bedside commode    Recommendations for Other Services       Precautions / Restrictions Precautions Precautions: Back Precaution Booklet Issued: No Precaution Comments: Reviewed back precautions during ADL. Pt able to recall 3/3. Handout previously provided by PT.  Restrictions Weight Bearing Restrictions: No      Mobility Bed Mobility Overal bed mobility: Needs Assistance Bed Mobility: Rolling;Sidelying to Sit;Sit to Sidelying Rolling: Supervision Sidelying to sit: Supervision;HOB elevated     Sit to sidelying: Supervision;HOB elevated General bed mobility comments: Pt reports that her bed is adjustable at home. Able to complete log roll with verbal cues.    Transfers Overall transfer level: Needs assistance Equipment used: None Transfers: Sit to/from Stand Sit to Stand: Min assist         General transfer comment: Min A for steadying during transfer.     Balance Overall balance assessment: Needs assistance Sitting-balance support: No upper extremity supported;Feet supported Sitting balance-Leahy Scale: Good     Standing balance support: Single extremity supported;During functional activity Standing balance-Leahy Scale: Fair Standing balance comment: Able to statically stand for ADL without UE support. Requires close supervision in standing.                            ADL either performed or assessed with clinical judgement   ADL Overall ADL's : Needs assistance/impaired Eating/Feeding: Set up;Sitting   Grooming: Supervision/safety;Standing   Upper Body Bathing: Set up;Sitting   Lower Body Bathing: Minimal assistance;Sit to/from stand   Upper Body Dressing : Set up;Sitting   Lower Body Dressing: Minimal assistance;Sit to/from stand   Toilet Transfer: Doctor, hospital Details (indicate cue type and reason): Close supervision. Pt moving slowly and cautiously.  Toileting- Clothing Manipulation and Hygiene: Supervision/safety;Sit to/from stand   Tub/ Shower Transfer: Min guard;Ambulation;Shower seat;Tub transfer   Functional mobility during ADLs: Supervision/safety General ADL Comments: Pt educated on back precautions during ADL as well as compensatory strategies to improve adherance to these and independence with ADL. Pt reports that her husband will provide assistance necessary for LB ADL. Educated pt on use of AE for LB ADL, compensatory strategies for dressing/bathing/toileting as well as 2 cup method for grooming tasks at sink.       Vision Patient Visual Report: No change from baseline Vision Assessment?: No apparent  visual deficits     Perception     Praxis       Pertinent Vitals/Pain Pain Assessment: 0-10 Pain Score: 5  Pain Location: back  Pain Descriptors / Indicators: Aching;Sore;Operative site guarding Pain Intervention(s): Limited activity within patient's tolerance;Monitored during session;Repositioned     Hand Dominance Right   Extremity/Trunk Assessment Upper Extremity Assessment Upper Extremity Assessment: Overall WFL for tasks assessed   Lower Extremity Assessment Lower Extremity Assessment: Generalized weakness   Cervical / Trunk Assessment Cervical / Trunk Assessment: Other exceptions Cervical / Trunk Exceptions: s/p spine surgery    Communication Communication Communication: No difficulties   Cognition Arousal/Alertness: Awake/alert Behavior During Therapy: WFL for tasks assessed/performed Overall Cognitive Status: Within Functional Limits for tasks assessed                                     General Comments  Pt's sister present during session.     Exercises     Shoulder Instructions      Home Living Family/patient expects to be discharged to:: Private residence Living Arrangements: Spouse/significant other Available Help at Discharge: Family;Available 24 hours/day Type of Home: Other(Comment) (townhome) Home Access: Stairs to enter [employer] of Steps: curb step and threshold step into house  Entrance Stairs-Rails: None Home Layout: One level     Bathroom Shower/Tub: Teacher, early years/pre: Standard     Home Equipment: Shower seat;Hand held Tourist information centre manager - 2 wheels;Cane - single point          Prior Functioning/Environment Level of Independence: Independent                 OT Problem List: Decreased strength;Decreased activity tolerance;Impaired balance (sitting and/or standing);Decreased safety awareness;Decreased knowledge of use of DME or AE;Decreased knowledge of precautions;Pain      OT Treatment/Interventions:      OT Goals(Current  goals can be found in the care plan section) Acute Rehab OT Goals Patient Stated Goal: to get back to doing housework and other things outside of home OT Goal Formulation: With patient/family Time For Goal Achievement: 05/07/17 Potential to Achieve Goals: Good  OT Frequency:     Barriers to D/C:            Co-evaluation              AM-PAC PT "6 Clicks" Daily Activity     Outcome Measure Help from another person eating meals?: None Help from another person taking care of personal grooming?: A Little Help from another person toileting, which includes using toliet, bedpan, or urinal?: A Little Help from another person bathing (including washing, rinsing, drying)?: A Little Help from another person to put on and taking off regular upper body clothing?: A Little Help from another person to put on and taking off regular lower body clothing?: A Little 6 Click Score: 19   End of Session Nurse Communication: Mobility status  Activity Tolerance: Patient tolerated treatment well Patient left: in bed;with call bell/phone within reach;with family/visitor present  OT Visit Diagnosis: Other abnormalities of gait and mobility (R26.89);Pain Pain - Right/Left:  (back) Pain - part of body:  (back)                Time: 1610-9604 OT Time Calculation (min): 19 min Charges:  OT General Charges $OT Visit: 1 Procedure OT Evaluation $OT Eval Moderate Complexity: 1 Procedure G-Codes: OT G-codes **NOT FOR INPATIENT  CLASS** Functional Assessment Tool Used: Clinical judgement Functional Limitation: Self care Self Care Current Status (P1025): At least 1 percent but less than 20 percent impaired, limited or restricted Self Care Goal Status (E5277): At least 1 percent but less than 20 percent impaired, limited or restricted Self Care Discharge Status [phone]): At least 1 percent but less than 20 percent impaired, limited or restricted   Norman Herrlich, [place] OTR/L  Pager: [place] 04/23/2017, 9:06 AM

## 2017-04-23 NOTE — Care Management CC44 (Signed)
Condition Code 44 Documentation Completed  Patient Details  Name: Danielle Castaneda MRN: [id number] Date of Birth: 02/02/1941   Condition Code 44 given:  Yes Patient signature on Condition Code 44 notice:  Yes Documentation of 2 MD's agreement:  Yes Code 44 added to claim:  Yes    Ninfa Meeker, RN 04/23/2017, 9:00 AM

## 2017-04-23 NOTE — Progress Notes (Signed)
Patient alert and oriented, mae's well, voiding adequate amount of urine, swallowing without difficulty, no c/o pain at time of discharge. Patient discharged home with family. Script and discharged instructions given to patient. Patient and family stated understanding of instructions given. Patient has an appointment with Dr.Stern    

## 2017-04-23 NOTE — Progress Notes (Signed)
Subjective: Patient reports doing well  Objective: Vital signs in last 24 hours: Temp:  [98 F (36.7 C)-100.4 F (38 C)] 98.3 F (36.8 C) (06/20 0729) Pulse Rate:  [57-77] 65 (06/20 0729) Resp:  [13-20] 18 (06/20 0729) BP: (83-148)/(52-83) 91/52 (06/20 0729) SpO2:  [94 %-100 %] 97 % (06/20 0729) Weight:  [74.4 kg (164 lb)] 74.4 kg (164 lb) (06/19 0926)  Intake/Output from previous day: 06/19 0701 - 06/20 0700 In: 1060 [P.O.:360; I.V.:700] Out: 5 [Blood:5] Intake/Output this shift: No intake/output data recorded.  Physical Exam: Full strength.  Dressings CDI.  Lab Results:  Recent Labs  04/22/17 0926  WBC 11.0*  HGB 15.5*  HCT 43.7  PLT 286   BMET  Recent Labs  04/22/17 0926  NA 138  K 3.0*  CL 98*  CO2 28  GLUCOSE 112*  BUN 20  CREATININE 1.10*  CALCIUM 10.1    Studies/Results: Dg Thoracic Spine 2 View  Result Date: 04/22/2017 CLINICAL DATA:  Kyphoplasty. EXAM: DG C-ARM 61-120 MIN; THORACIC SPINE 2 VIEWS COMPARISON:  03/03/2017 . FINDINGS: T11 vertebroplasty with methylmethacrylate noted within the T11 vertebral body. Prior T12 vertebroplasty 2 minutes 19 seconds fluoroscopy time. Two images obtained. IMPRESSION: T11 vertebroplasty. Electronically Signed   By: Marcello Moores  Register   On: 04/22/2017 13:21   Dg C-arm 1-60 Min  Result Date: 04/22/2017 CLINICAL DATA:  Kyphoplasty. EXAM: DG C-ARM 61-120 MIN; THORACIC SPINE 2 VIEWS COMPARISON:  03/03/2017 . FINDINGS: T11 vertebroplasty with methylmethacrylate noted within the T11 vertebral body. Prior T12 vertebroplasty 2 minutes 19 seconds fluoroscopy time. Two images obtained. IMPRESSION: T11 vertebroplasty. Electronically Signed   By: Marcello Moores  Register   On: 04/22/2017 13:21    Assessment/Plan: Patient is doing well.  Discharge home.    LOS: 1 day    Peggyann Shoals, MD 04/23/2017, 8:15 AM

## 2017-05-02 NOTE — Progress Notes (Signed)
   04/22/17 1644  PT G-Codes **NOT FOR INPATIENT CLASS**  Functional Assessment Tool Used AM-PAC 6 Clicks Basic Mobility;Clinical judgement  Functional Limitation Mobility: Walking and moving around  Mobility: Walking and Moving Around Current Status (S1779) CL  Mobility: Walking and Moving Around Goal Status (T9030) CI   Inserting G codes  Leighton Ruff, PT, DPT  Acute Rehabilitation Services  Pager: [phone]

## 2018-01-30 IMAGING — MR MR LUMBAR SPINE W/O CM
4 of 5 series · 25 of 48 positions shown · non-contrast
Comparison: 12/25/2015.

CLINICAL DATA: Increasing back pain. Previous vertebral
augmentation.

EXAM:
MRI LUMBAR SPINE WITHOUT CONTRAST
TECHNIQUE: Multiplanar, multisequence MR imaging of the lumbar spine was
performed. No intravenous contrast was administered.

[Series 4: T2 · sagittal · 4.0mm · 0.47mm/px · 5 of 12 slices shown (1 of 2)]
[im 1/12]
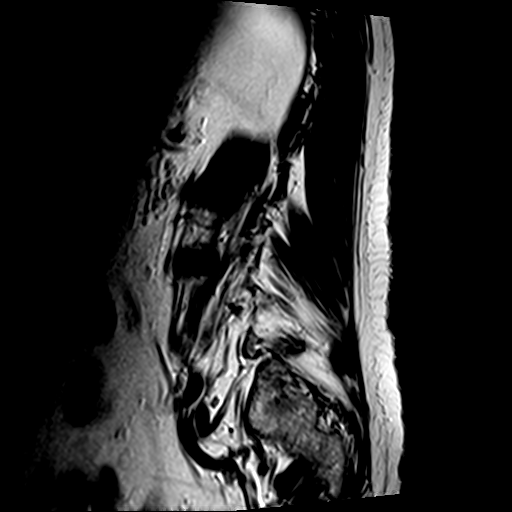
[im 3/12]
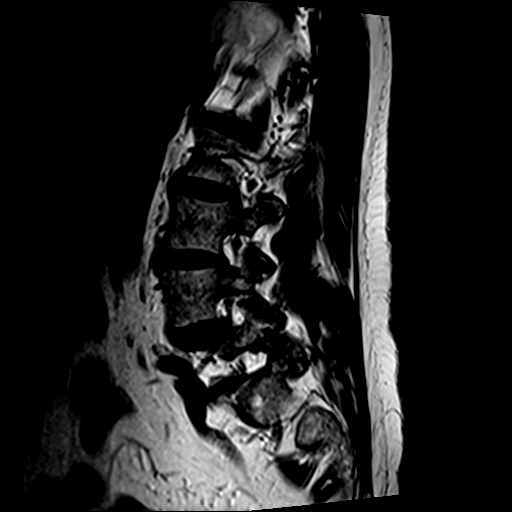
[im 6/12]
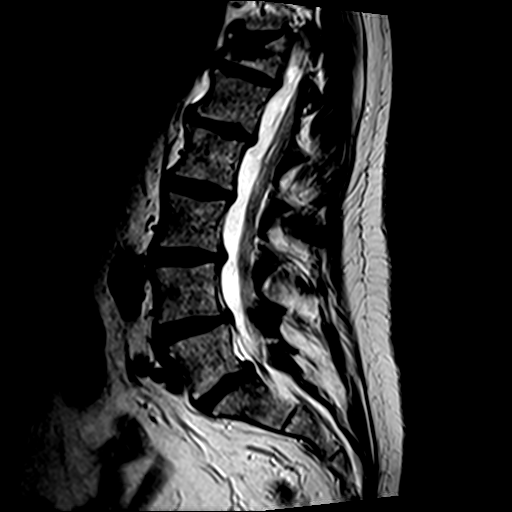
[im 9/12]
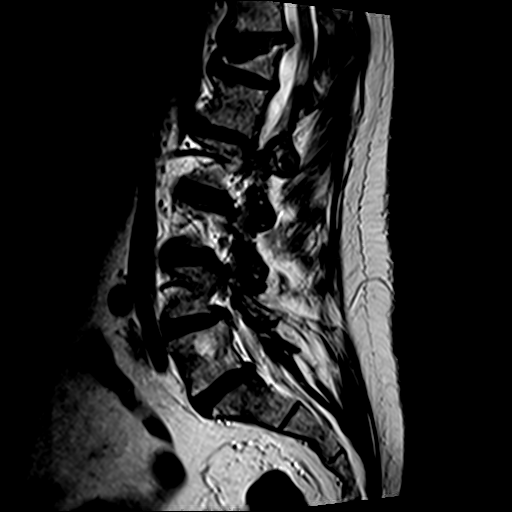
[im 12/12]
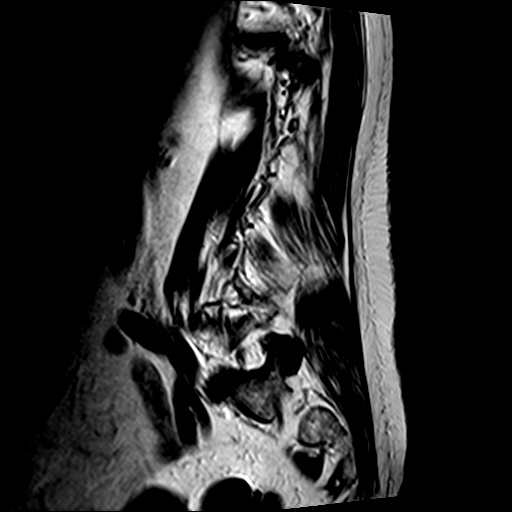

[Series 8: T1 · axial · 4.0mm · 0.37mm/px · z∈[-26,+134]mm · 7 of 34 slices shown (1 of 2)]
[im 3/34]
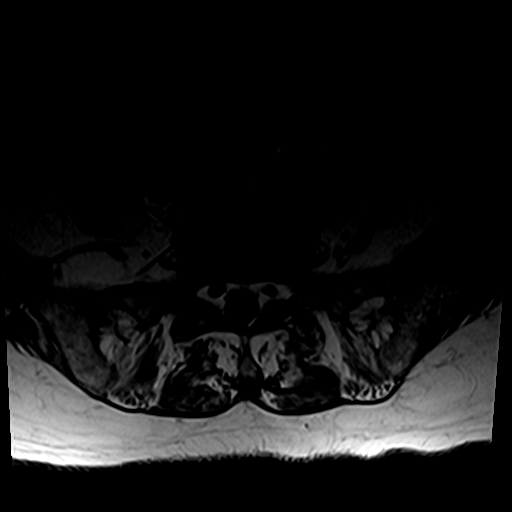
[im 5/34]
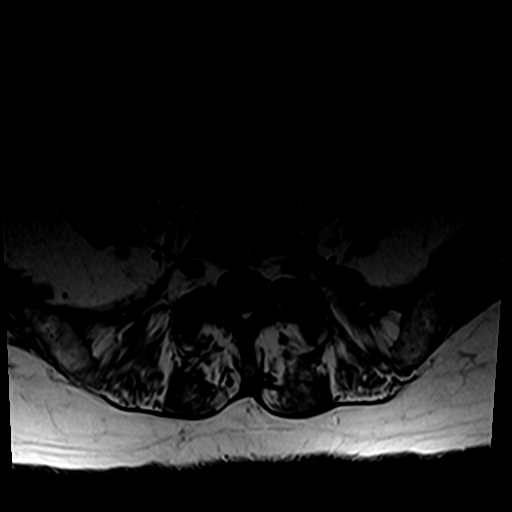
[im 7/34]
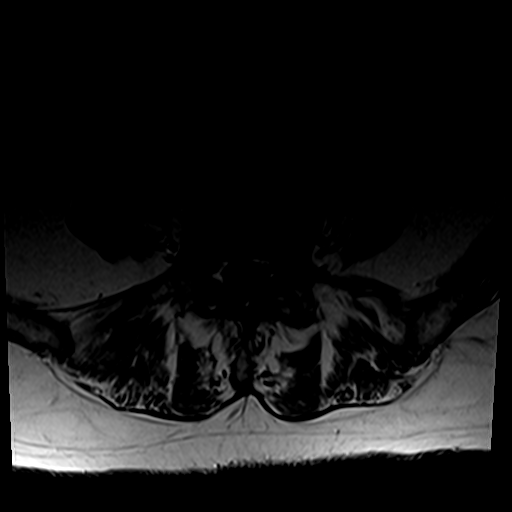
[im 12/34]
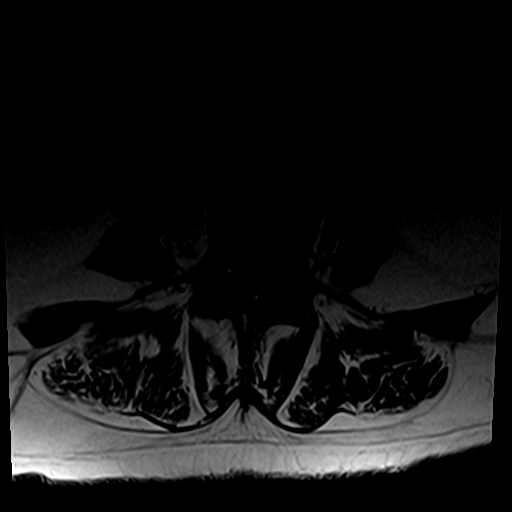
[im 16/34]
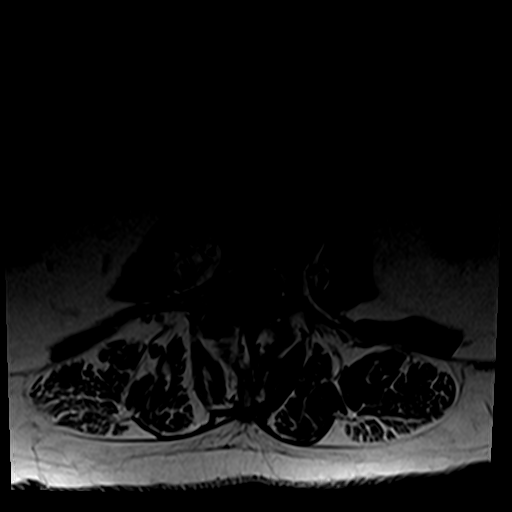
[im 18/34]
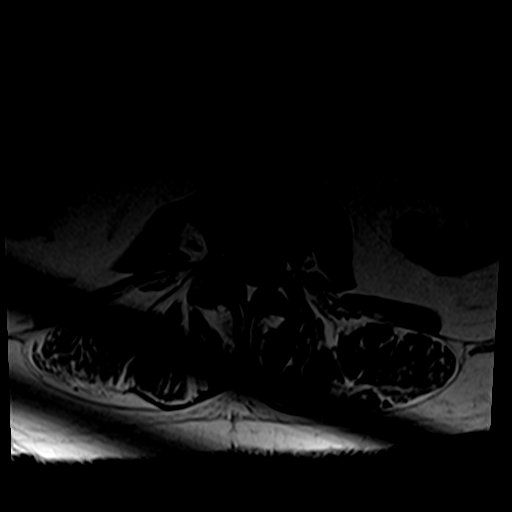
[im 29/34]
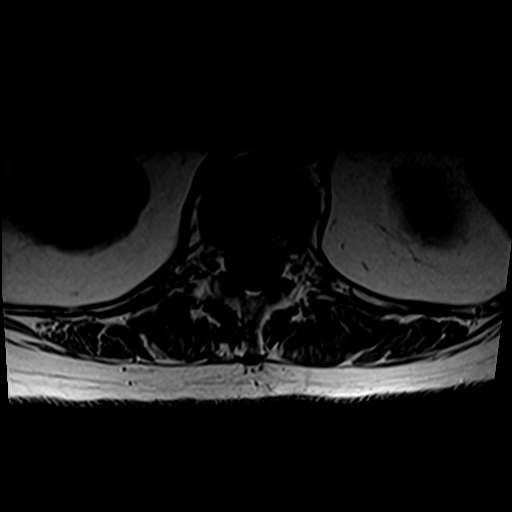

[Series 9: T2 · axial · 4.0mm · 0.74mm/px · z∈[-26,+159]mm · 10 of 34 slices shown (2 of 2)]
[im 3/34]
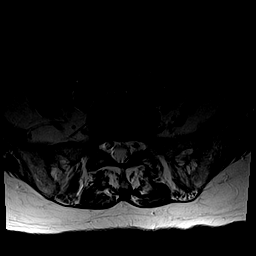
[im 5/34]
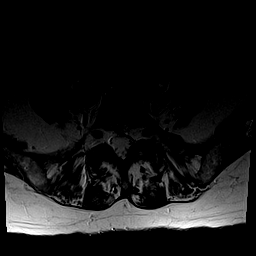
[im 7/34]
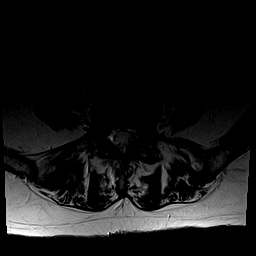
[im 12/34]
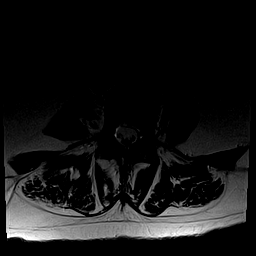
[im 16/34]
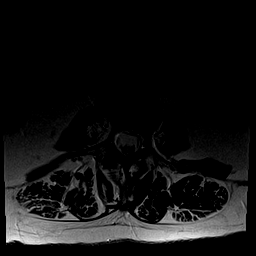
[im 18/34]
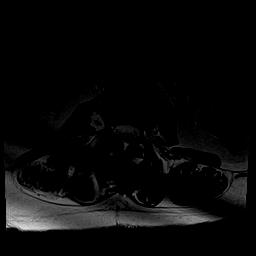
[im 20/34]
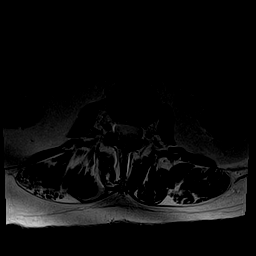
[im 25/34]
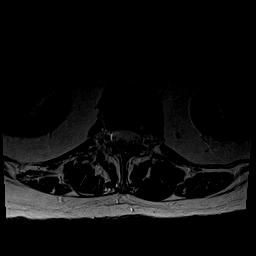
[im 29/34]
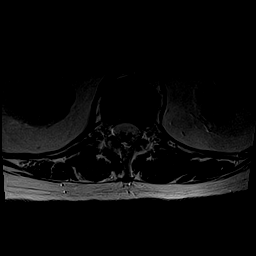
[im 34/34]
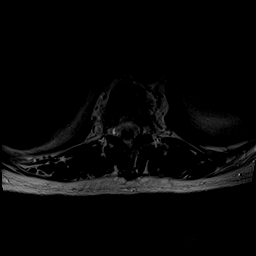

[Series 10: T1 · sagittal · 4.0mm · 0.47mm/px · 3 of 12 slices shown (2 of 2)]
[im 3/12]
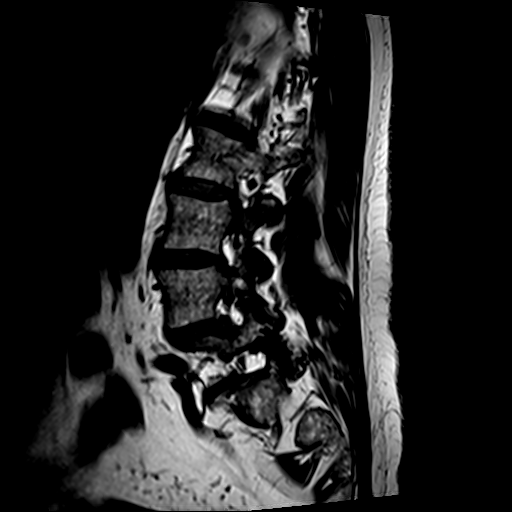
[im 7/12]
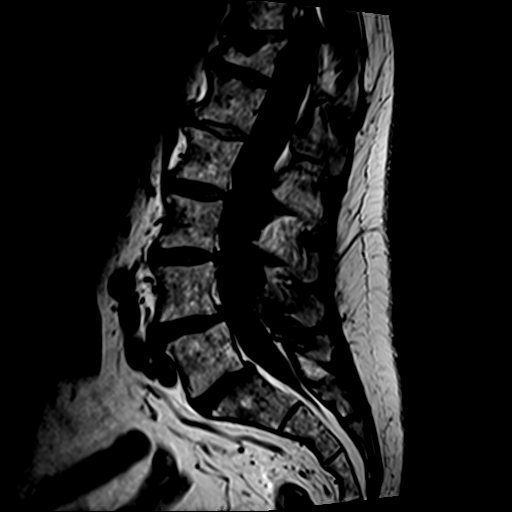
[im 12/12]
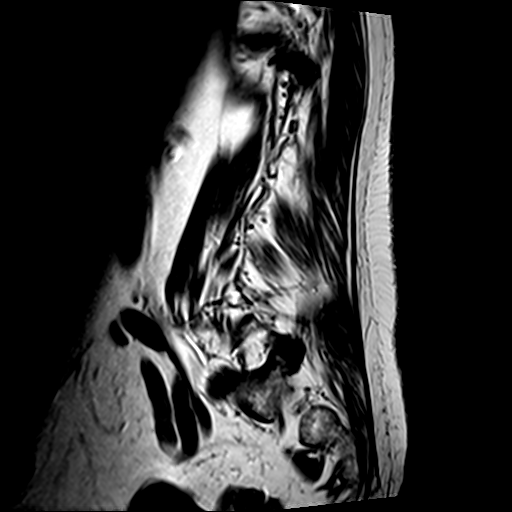

[25 of 48 positions shown; findings below may reference images not displayed]

FINDINGS: Segmentation:  Standard.

Alignment: Degenerative scoliosis convex LEFT mid lumbar region.
Approximately 2 mm anterolisthesis L4-5 and 1 mm anterolisthesis
L5-S1.

Vertebrae: There has been vertebral augmentation at T12. No
significant residual edema or further collapse. Retropulsed bone
superiorly does not compress the conus. Methylmethacrylate extends
into the disc space anteriorly at T11-12. There is mild bone marrow
edema related to anterior inferior vertebral body corner fracture at
T11. There are no worrisome osseous lesions to suggest metastatic
breast cancer.

Conus medullaris: Extends to the L1 level and appears normal.

Paraspinal and other soft tissues: Unremarkable.

Disc levels:

L1-L2:  Disc space narrowing.  Annular bulge.  No impingement.

L2-L3: Disc space narrowing. Annular bulge. Facet arthropathy. No
impingement. All

L3-L4: Asymmetric loss of interspace height on the RIGHT. Annular
bulge. Facet arthropathy. No impingement.

L4-L5: Advanced facet arthropathy and ligamentum flavum hypertrophy,
worse on the LEFT. Uncovering of the disc, annular bulging, without
frank protrusion. BILATERAL subarticular zone and foraminal zone
narrowing could affect the L5 and L4 nerve roots.

L5-S1: 1-2 mm anterolisthesis. Uncovering of the disc with annular
bulge. Posterior element hypertrophy. No definite impingement.

Compared with priors, similar appearance.
IMPRESSION: Multilevel spondylosis, worst at L4-5. Facet mediated
anterolisthesis, with uncovering of the disc as well as posterior
element hypertrophy could affect the L4 and/or L5 nerve roots.
Similar appearance to 5241.

Status post T12 vertebral augmentation. Extraosseous migration of
cement into the T11-12 interspace. Mild bone marrow edema T11, with
small anterior inferior corner fracture, uncertain significance.

## 2018-03-11 IMAGING — RF DG THORACIC SPINE 2V
1 series · 1 of 1 positions shown · non-contrast
Comparison: 03/03/2017 .

CLINICAL DATA: Kyphoplasty.

EXAM:
DG C-ARM 61-120 MIN; THORACIC SPINE 2 VIEWS

[Series 1: run · 1 of 1 slices shown]
[im 1/1]
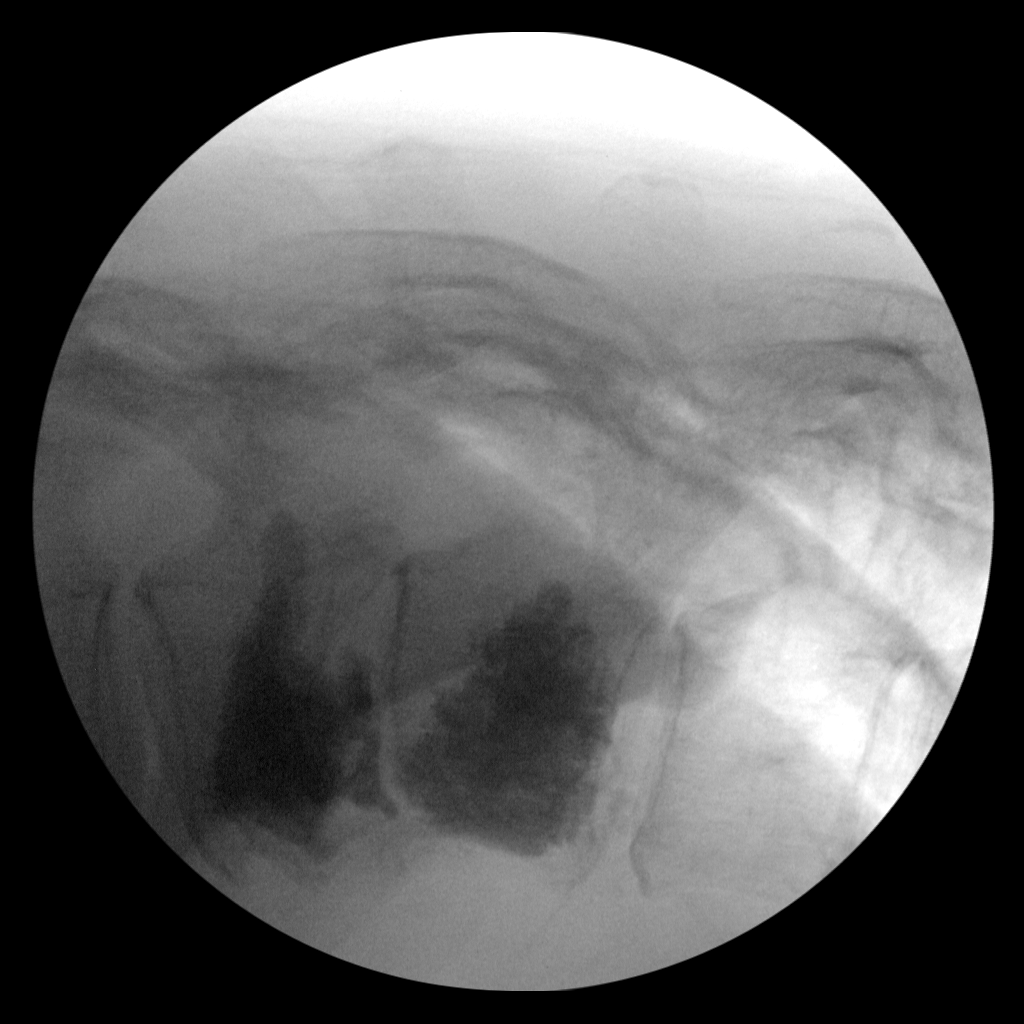

[1 of 1 positions shown; findings below may reference images not displayed]

FINDINGS: T11 vertebroplasty with methylmethacrylate noted within the T11
fluoroscopy time. Two images obtained.
IMPRESSION: T11 vertebroplasty.

## 2019-04-09 ENCOUNTER — Encounter: Payer: Self-pay | Admitting: Internal Medicine

## 2023-07-09 ENCOUNTER — Encounter: Payer: Self-pay | Admitting: "Endocrinology

## 2023-07-09 ENCOUNTER — Ambulatory Visit (INDEPENDENT_AMBULATORY_CARE_PROVIDER_SITE_OTHER): Payer: Medicare Other | Admitting: "Endocrinology

## 2023-07-09 VITALS — BP 104/70 | HR 52 | Ht 64.0 in | Wt 164.8 lb

## 2023-07-09 DIAGNOSIS — E890 Postprocedural hypothyroidism: Secondary | ICD-10-CM | POA: Diagnosis not present

## 2023-07-09 MED ORDER — LEVOTHYROXINE SODIUM 125 MCG PO TABS: 125.0000 ug | ORAL_TABLET | Freq: Every day | ORAL | 1 refills | Status: DC

## 2023-07-09 NOTE — Progress Notes (Signed)
Endocrinology Consult Note                                            07/09/2023, 12:12 PM   Subjective:    Patient ID: Danielle Castaneda, female    DOB: [birth year], PCP Daryel Gerald, FNP   Past Medical History:  Diagnosis Date   Anxiety    Arthritis    Cancer (HCC)    breast    Depression    Glaucoma    Hypertension    Hypothyroidism    Lupus (HCC)    Pneumonia    hx   RA (rheumatoid arthritis) (HCC)    Wedge compression fracture of eleventh thoracic vertebra (HCC)    and twelth   Past Surgical History:  Procedure Laterality Date   BACK SURGERY  01/2016   neck surgery   BREAST SURGERY Bilateral    mastectomy   CATARACT EXTRACTION, BILATERAL     DILATION AND CURETTAGE OF UTERUS     KYPHOPLASTY N/A 10/04/2016   Procedure: T12 KYPHOPLASTY;  Surgeon: Maeola Harman, MD;  Location: [facility] OR;  Service: Neurosurgery;  Laterality: N/A;  T12 KYPHOPLASTY   KYPHOPLASTY N/A 04/22/2017   Procedure: Thoracic Eleven Kyphoplasty;  Surgeon: Maeola Harman, MD;  Location: [facility] OR;  Service: Neurosurgery;  Laterality: N/A;   Social History   Socioeconomic History   Marital status: Married    Spouse name: Not on file   Number of children: Not on file   Years of education: Not on file   Highest education level: Not on file  Occupational History   Not on file  Tobacco Use   Smoking status: Never   Smokeless tobacco: Never  Vaping Use   Vaping status: Never Used  Substance and Sexual Activity   Alcohol use: No   Drug use: No   Sexual activity: Not on file  Other Topics Concern   Not on file  Social History Narrative   Not on file   Social Determinants of Health   Financial Resource Strain: Not on file  Food Insecurity: Not on file  Transportation Needs: Not on file  Physical Activity: Not on file  Stress: Not on file  Social Connections: Not on file   Family History  Problem Relation Age of Onset   Hypertension Mother    Stroke Mother    Hyperlipidemia Mother     Cancer Father    Hyperlipidemia Father    Hypertension Father    Stroke Father    Outpatient Encounter Medications as of 07/09/2023  Medication Sig   acetaminophen (TYLENOL) 500 MG tablet Take 500 mg by mouth every 4 (four) hours as needed.   cyclobenzaprine (FLEXERIL) 10 MG tablet Take 1 tablet by mouth 3 (three) times daily.   ENTRESTO 24-26 MG Take 1 tablet by mouth 2 (two) times daily.   gabapentin (NEURONTIN) 600 MG tablet Take 600 mg by mouth 3 (three) times daily.   hydrOXYzine (ATARAX) 10 MG tablet Take 1 tablet by mouth daily as needed.   metoprolol tartrate (LOPRESSOR) 25 MG tablet Take 25 mg by mouth 2 (two) times daily.   montelukast (SINGULAIR) 10 MG tablet Take 10 mg by mouth at bedtime.   ondansetron (ZOFRAN) 4 MG tablet Take 1 tablet by mouth 3 (three) times daily as needed.   pravastatin (PRAVACHOL) 20 MG tablet Take 1 tablet  by mouth daily.   spironolactone (ALDACTONE) 25 MG tablet Take 25 mg by mouth daily.   traZODone (DESYREL) 100 MG tablet Take 1 tablet by mouth at bedtime.   [DISCONTINUED] levothyroxine (SYNTHROID) 150 MCG tablet Take 1 tablet by mouth daily.   ALPRAZolam (XANAX) 1 MG tablet Take 1 mg by mouth at bedtime.   escitalopram (LEXAPRO) 20 MG tablet Take 20 mg by mouth daily.   hydroxychloroquine (PLAQUENIL) 200 MG tablet Take 400 mg by mouth daily.    latanoprost (XALATAN) 0.005 % ophthalmic solution Place 1 drop into both eyes at bedtime.   levothyroxine (SYNTHROID) 125 MCG tablet Take 1 tablet (125 mcg total) by mouth daily before breakfast.   sulfaSALAzine (AZULFIDINE) 500 MG tablet Take 1,000 mg by mouth 3 (three) times daily.   Vitamin D, Ergocalciferol, (DRISDOL) 50000 units CAPS capsule Take 50,000 Units by mouth every 7 (seven) days. Saturdays   [DISCONTINUED] cetirizine (ZYRTEC) 10 MG tablet Take 10 mg by mouth daily as needed for allergies.    [DISCONTINUED] Coenzyme Q10 (COQ10) 200 MG CAPS Take 1 capsule by mouth daily.   [DISCONTINUED]  gabapentin (NEURONTIN) 800 MG tablet Take 800 mg by mouth at bedtime.   [DISCONTINUED] HYDROcodone-acetaminophen (NORCO) 10-325 MG tablet Take 0.5 tablets by mouth every 6 (six) hours as needed for pain.   [DISCONTINUED] levothyroxine (SYNTHROID, LEVOTHROID) 175 MCG tablet Take 175 mcg by mouth daily before breakfast.   [DISCONTINUED] Magnesium 400 MG TABS Take 1 tablet by mouth daily.   [DISCONTINUED] methocarbamol (ROBAXIN) 750 MG tablet Take 1 tablet (750 mg total) by mouth every 6 (six) hours as needed for muscle spasms. (Patient not taking: Reported on 04/18/2017)   [DISCONTINUED] metoCLOPramide (REGLAN) 5 MG tablet Take 5 mg by mouth 3 (three) times daily as needed for nausea.   [DISCONTINUED] Omega-3 Krill Oil 500 MG CAPS Take by mouth.   [DISCONTINUED] oxyCODONE-acetaminophen (PERCOCET) 10-325 MG tablet Take 0.5-1 tablets by mouth every 6 (six) hours as needed for pain. (Patient not taking: Reported on 04/18/2017)   [DISCONTINUED] tiZANidine (ZANAFLEX) 4 MG tablet Take 4 mg by mouth at bedtime.   [DISCONTINUED] triamcinolone cream (KENALOG) 0.1 % Apply 1 application topically 2 (two) times daily as needed (itching).   [DISCONTINUED] triamterene-hydrochlorothiazide (DYAZIDE) 37.5-25 MG capsule Take 1 capsule by mouth daily.   [DISCONTINUED] Turmeric Curcumin 500 MG CAPS Take 1 capsule by mouth daily. With 50 mg of Ginger   [DISCONTINUED] zolpidem (AMBIEN) 10 MG tablet Take 5 mg by mouth at bedtime. And if pt is unable to fall asleep will take the other half of the tablet   No facility-administered encounter medications on file as of 07/09/2023.   ALLERGIES: Allergies  Allergen Reactions   Levaquin [Levofloxacin]     Aching and joint pain   Prednisone    Tetracyclines & Related Rash    VACCINATION STATUS: Immunization History  Administered Date(s) Administered   Moderna Sars-Covid-2 Vaccination 01/07/2020, 02/04/2020, 12/26/2020    HPI Danielle Castaneda is 82 y.o. female who presents  today with a medical history as above. she is being seen in consultation for hypothyroidism requested by Daryel Gerald, FNP.  She is accompanied by her husband and daughter to clinic.  History is obtained from the family as well as chart review. According to the patient, she underwent what appears to be thyroid radioactive iodine ablation in her 40s to treat hyperthyroidism.  Ever since then, she was treated with levothyroxine to treat cardiac induced hypothyroidism.  She took various doses over  the years.  She is currently on levothyroxine 150 mcg p.o. daily before breakfast.  She takes this medication consistently, however she is taking daily supplements of calcium as well as weekly vitamin D.   Her most recent thyroid function tests where fluctuating indicating under replacement alternating with over replacement.  She presents with minimally fluctuating body weight.  She has no acute complaints today.  She uses a walker due to disequilibrium.  Her other medical problems include hypertension, hyperlipidemia, breast cancer, CHF, osteoporosis.  She denies palpitations, tremors, nor heat intolerance.  She denies dysphagia, shortness of breath, nor voice change.  Review of Systems  Constitutional: +mildly fluctuating body weight, no fatigue, no subjective hyperthermia, no subjective hypothermia Eyes: no blurry vision, no xerophthalmia ENT: no sore throat, no nodules palpated in throat, no dysphagia/odynophagia, no hoarseness Cardiovascular: no Chest Pain, no Shortness of Breath, no palpitations, no leg swelling Respiratory: no cough, no shortness of breath Gastrointestinal: no Nausea/Vomiting/Diarhhea Musculoskeletal: no muscle/joint aches Skin: no rashes Neurological: no tremors, no numbness, no tingling, no dizziness Psychiatric: no depression, no anxiety  Objective:       07/09/2023    9:34 AM 04/23/2017    7:29 AM 04/23/2017    4:22 AM  Vitals with BMI  Height 5\' 4"     Weight 164 lbs  13 oz    BMI 28.27    Systolic 104 91 101  Diastolic 70 52 61  Pulse 52 65 57    BP 104/70   Pulse (!) 52   Ht 5\' 4"  (1.626 m)   Wt 164 lb 12.8 oz (74.8 kg)   BMI 28.29 kg/m   Wt Readings from Last 3 Encounters:  07/09/23 164 lb 12.8 oz (74.8 kg)  04/22/17 164 lb (74.4 kg)  10/04/16 169 lb (76.7 kg)    Physical Exam  Constitutional:  Body mass index is 28.29 kg/m.,  not in acute distress, normal state of mind + walks with a walker. Eyes: PERRLA, EOMI, no exophthalmos ENT: moist mucous membranes, no gross thyromegaly, no gross cervical lymphadenopathy Cardiovascular: normal precordial activity, Regular Rate and Rhythm, no Murmur/Rubs/Gallops Respiratory:  adequate breathing efforts, no gross chest deformity, Clear to auscultation bilaterally Gastrointestinal: abdomen soft, Non -tender, No distension, Bowel Sounds present, no gross organomegaly Musculoskeletal: no gross deformities, strength intact in all four extremities, no peripheral edema Skin: moist, warm, no rashes Neurological: no tremor with outstretched hands, Deep tendon reflexes normal in bilateral lower extremities.  CMP ( most recent) CMP     Component Value Date/Time   NA 138 04/22/2017 0926   K 3.0 (L) 04/22/2017 0926   CL 98 (L) 04/22/2017 0926   CO2 28 04/22/2017 0926   GLUCOSE 112 (H) 04/22/2017 0926   BUN 20 04/22/2017 0926   CREATININE 1.10 (H) 04/22/2017 0926   CALCIUM 10.1 04/22/2017 0926   GFRNONAA 48 (L) 04/22/2017 0926   January 16, 2023 labs: Vitamin D 42, TSH 0.18, free T4 1.79 Total cholesterol 157, triglyceride 102, HDL 65, LDL 72.  April 10, 2023 labs: TSH 61.5, free T4 0.93    Assessment & Plan:   1. Hypothyroidism following radioiodine therapy  - Linetta Manzella  is being seen at a kind request of Nolen Mu, [place], [place]. - I have reviewed her available thyroid records and clinically evaluated the patient. - Based on these reviews, she has RAI induced hypothyroidism with recent difficulty  regulating her thyroid hormone doses. Priority should be to avoid overtreatment in this patient.  I discussed and  lowered her levothyroxine to 125 mcg p.o. daily before breakfast.   - We discussed about the correct intake of her thyroid hormone, on empty stomach at fasting, with water, separated by at least 30 minutes from breakfast and other medications,  and separated by more than 4 hours from calcium, iron, multivitamins, acid reflux medications (PPIs). -Patient is made aware of the fact that thyroid hormone replacement is needed for life, dose to be adjusted by periodic monitoring of thyroid function tests.  In the absence of goiter, she would not need thyroid imaging at this time.   - she is advised to maintain close follow up with Daryel Gerald, FNP for primary care needs.   -Thank you for involving me in the care of this pleasant patient.  Time spent with the patient: 46  minutes, of which >50% was spent in  counseling her about her RAI induced hypothyroidism and the rest in obtaining information about her symptoms, reviewing her previous labs/studies ( including abstractions from other facilities),  evaluations, and treatments,  and developing a plan to confirm diagnosis and long term treatment based on the latest standards of care/guidelines; and documenting her care.  Jaili Vancleef participated in the discussions, expressed understanding, and voiced agreement with the above plans.  All questions were answered to her satisfaction. she is encouraged to contact clinic should she have any questions or concerns prior to her return visit.  Follow up plan: Return in about 3 months (around 10/08/2023) for F/U with Pre-visit Labs.   Marquis Lunch, MD [facility] [address] [place], [place] [address] Phone: [phone]  Fax: [phone]     07/09/2023, 12:12 PM  This note was partially dictated with voice recognition software.  Similar sounding words can be transcribed inadequately or may not  be corrected upon review.

## 2023-07-29 LAB — LAB REPORT - SCANNED
Free T4: 1.5 ng/dL
TSH: 23 — AB (ref 0.41–5.90)

## 2023-08-07 ENCOUNTER — Telehealth: Payer: Self-pay

## 2023-08-07 NOTE — Telephone Encounter (Signed)
Faxed medical records request to [facility] for thyroid labs from last hospital stay.

## 2023-08-07 NOTE — Telephone Encounter (Signed)
-----   Message from [place] Nida sent at 08/06/2023  4:01 PM EDT ----- Ander Slade, There was a msg coming in about this patient having to go to ER and was found to have low thyroid labs. Would you call this patient and get any recent thyroid labs?

## 2023-10-14 ENCOUNTER — Ambulatory Visit: Payer: Medicare Other | Admitting: "Endocrinology

## 2023-12-24 ENCOUNTER — Ambulatory Visit: Payer: Medicare Other | Admitting: "Endocrinology

## 2023-12-25 ENCOUNTER — Ambulatory Visit: Payer: Medicare Other | Admitting: "Endocrinology

## 2024-02-11 LAB — LAB REPORT - SCANNED
EGFR: 95
Free T4: 1.75 ng/dL
TSH: 0.28 — AB (ref 0.41–5.90)

## 2024-02-12 ENCOUNTER — Encounter: Payer: Self-pay | Admitting: Family Medicine

## 2024-03-15 ENCOUNTER — Telehealth: Payer: Self-pay | Admitting: "Endocrinology

## 2024-03-15 ENCOUNTER — Other Ambulatory Visit: Payer: Self-pay | Admitting: "Endocrinology

## 2024-03-15 DIAGNOSIS — E890 Postprocedural hypothyroidism: Secondary | ICD-10-CM

## 2024-03-15 MED ORDER — LEVOTHYROXINE SODIUM 112 MCG PO TABS: 112.0000 ug | ORAL_TABLET | Freq: Every day | ORAL | 1 refills | Status: DC

## 2024-03-15 NOTE — Telephone Encounter (Signed)
 See above message

## 2024-03-15 NOTE — Telephone Encounter (Signed)
 Sending to [facility] for review and recommendations.

## 2024-03-15 NOTE — Telephone Encounter (Signed)
 Pt said her PCP sent her labs to us  in April. She is bed ridden and cannot walk. PLease Adivse on her results as pt does not cannot come to an appt

## 2024-03-17 ENCOUNTER — Other Ambulatory Visit: Payer: Self-pay

## 2024-03-17 MED ORDER — LEVOTHYROXINE SODIUM 112 MCG PO TABS: 112.0000 ug | ORAL_TABLET | Freq: Every day | ORAL | 1 refills | Status: AC

## 2024-03-18 NOTE — Telephone Encounter (Signed)
 Spoke with pt advising her to lower her dose of Levothyroxine  112mcg daily before breakfast and she needs repeat TSH and Free T4 in 3 months per Dr.Nida's orders. Rx for Levothyroxine  112mcg daily before breakfast sent to pharmacy. Pt voiced understanding.

## 2024-05-20 ENCOUNTER — Telehealth: Payer: Self-pay | Admitting: *Deleted

## 2024-05-20 NOTE — Telephone Encounter (Signed)
 Left a message requesting pt return call to the office.

## 2024-05-20 NOTE — Telephone Encounter (Signed)
 Patient left a voicemail. She shared that she was wanting to get a copy of Lab work orders. She states that she is unable to work, totally bedridden.

## 2024-05-21 NOTE — Telephone Encounter (Signed)
 Pt is being seen by [facility] PT. Will send orders for lab work to be drawn.

## 2024-05-26 LAB — T4, FREE: Free T4: 1.62 ng/dL (ref 0.82–1.77)

## 2024-05-26 LAB — TSH: TSH: 4.99 u[IU]/mL — ABNORMAL HIGH (ref 0.450–4.500)

## 2024-08-04 DEATH — deceased
